# Patient Record
Sex: Male | Born: 1940 | Race: White | Hispanic: No | Marital: Married | Smoking: Former smoker
Health system: Southern US, Community
[De-identification: ages and names within clinical notes are randomized; demographics above are authoritative.]

## PROBLEM LIST (undated history)

## (undated) DIAGNOSIS — M1990 Unspecified osteoarthritis, unspecified site: Secondary | ICD-10-CM

## (undated) DIAGNOSIS — I714 Abdominal aortic aneurysm, without rupture: Secondary | ICD-10-CM

## (undated) DIAGNOSIS — E669 Obesity, unspecified: Secondary | ICD-10-CM

## (undated) DIAGNOSIS — G250 Essential tremor: Secondary | ICD-10-CM

## (undated) DIAGNOSIS — E785 Hyperlipidemia, unspecified: Secondary | ICD-10-CM

## (undated) DIAGNOSIS — R269 Unspecified abnormalities of gait and mobility: Secondary | ICD-10-CM

## (undated) DIAGNOSIS — I679 Cerebrovascular disease, unspecified: Secondary | ICD-10-CM

## (undated) DIAGNOSIS — I639 Cerebral infarction, unspecified: Secondary | ICD-10-CM

## (undated) DIAGNOSIS — I209 Angina pectoris, unspecified: Secondary | ICD-10-CM

## (undated) DIAGNOSIS — I10 Essential (primary) hypertension: Secondary | ICD-10-CM

## (undated) DIAGNOSIS — N200 Calculus of kidney: Secondary | ICD-10-CM

## (undated) HISTORY — DX: Hyperlipidemia, unspecified: E78.5

## (undated) HISTORY — DX: Unspecified osteoarthritis, unspecified site: M19.90

## (undated) HISTORY — DX: Unspecified abnormalities of gait and mobility: R26.9

## (undated) HISTORY — DX: Obesity, unspecified: E66.9

## (undated) HISTORY — DX: Angina pectoris, unspecified: I20.9

## (undated) HISTORY — DX: Essential (primary) hypertension: I10

## (undated) HISTORY — DX: Cerebrovascular disease, unspecified: I67.9

---

## 1975-09-03 DIAGNOSIS — N200 Calculus of kidney: Secondary | ICD-10-CM

## 1975-09-03 HISTORY — DX: Calculus of kidney: N20.0

## 1994-09-02 DIAGNOSIS — I714 Abdominal aortic aneurysm, without rupture, unspecified: Secondary | ICD-10-CM

## 1994-09-02 HISTORY — DX: Abdominal aortic aneurysm, without rupture: I71.4

## 1994-09-02 HISTORY — PX: ABDOMINAL AORTIC ANEURYSM REPAIR: SUR1152

## 1994-09-02 HISTORY — PX: HERNIA REPAIR: SHX51

## 1994-09-02 HISTORY — DX: Abdominal aortic aneurysm, without rupture, unspecified: I71.40

## 1994-09-02 HISTORY — PX: CHOLECYSTECTOMY: SHX55

## 2002-09-02 DIAGNOSIS — I639 Cerebral infarction, unspecified: Secondary | ICD-10-CM

## 2002-09-02 HISTORY — DX: Cerebral infarction, unspecified: I63.9

## 2003-04-25 ENCOUNTER — Ambulatory Visit (HOSPITAL_COMMUNITY): Admission: RE | Admit: 2003-04-25 | Discharge: 2003-04-25 | Payer: Self-pay | Admitting: Emergency Medicine

## 2003-04-25 ENCOUNTER — Encounter: Payer: Self-pay | Admitting: Emergency Medicine

## 2003-05-05 ENCOUNTER — Ambulatory Visit (HOSPITAL_COMMUNITY): Admission: RE | Admit: 2003-05-05 | Discharge: 2003-05-05 | Payer: Self-pay | Admitting: Neurology

## 2003-05-05 ENCOUNTER — Encounter: Payer: Self-pay | Admitting: Neurology

## 2003-05-11 ENCOUNTER — Encounter: Payer: Self-pay | Admitting: Cardiology

## 2003-05-11 ENCOUNTER — Ambulatory Visit (HOSPITAL_COMMUNITY): Admission: RE | Admit: 2003-05-11 | Discharge: 2003-05-11 | Payer: Self-pay | Admitting: Neurology

## 2003-10-25 ENCOUNTER — Ambulatory Visit (HOSPITAL_COMMUNITY): Admission: RE | Admit: 2003-10-25 | Discharge: 2003-10-25 | Payer: Self-pay | Admitting: Neurology

## 2010-03-22 ENCOUNTER — Encounter: Admission: RE | Admit: 2010-03-22 | Discharge: 2010-03-22 | Payer: Self-pay | Admitting: Family Medicine

## 2011-04-08 ENCOUNTER — Ambulatory Visit
Admission: RE | Admit: 2011-04-08 | Discharge: 2011-04-08 | Disposition: A | Discharge status: Home / Self Care | Payer: BC Managed Care – PPO | Source: Ambulatory Visit | Attending: Family Medicine | Admitting: Family Medicine

## 2011-04-08 ENCOUNTER — Other Ambulatory Visit: Payer: Self-pay | Admitting: Family Medicine

## 2011-04-08 DIAGNOSIS — R0789 Other chest pain: Secondary | ICD-10-CM

## 2011-04-23 ENCOUNTER — Ambulatory Visit: Payer: BC Managed Care – PPO | Attending: Family Medicine

## 2011-04-23 DIAGNOSIS — IMO0001 Reserved for inherently not codable concepts without codable children: Secondary | ICD-10-CM | POA: Insufficient documentation

## 2011-04-23 DIAGNOSIS — M546 Pain in thoracic spine: Secondary | ICD-10-CM | POA: Insufficient documentation

## 2011-11-07 ENCOUNTER — Ambulatory Visit: Payer: Medicare Other | Attending: Family Medicine

## 2011-11-07 DIAGNOSIS — R5381 Other malaise: Secondary | ICD-10-CM | POA: Insufficient documentation

## 2011-11-07 DIAGNOSIS — IMO0001 Reserved for inherently not codable concepts without codable children: Secondary | ICD-10-CM | POA: Insufficient documentation

## 2011-11-07 DIAGNOSIS — M25619 Stiffness of unspecified shoulder, not elsewhere classified: Secondary | ICD-10-CM | POA: Insufficient documentation

## 2011-11-07 DIAGNOSIS — M25519 Pain in unspecified shoulder: Secondary | ICD-10-CM | POA: Insufficient documentation

## 2011-11-08 ENCOUNTER — Ambulatory Visit: Payer: Medicare Other | Admitting: Physical Therapy

## 2011-11-11 ENCOUNTER — Ambulatory Visit: Payer: Medicare Other

## 2011-11-14 ENCOUNTER — Ambulatory Visit: Payer: Medicare Other

## 2011-11-18 ENCOUNTER — Ambulatory Visit: Payer: Medicare Other

## 2011-11-21 ENCOUNTER — Ambulatory Visit: Payer: Medicare Other

## 2012-08-16 DIAGNOSIS — I63239 Cerebral infarction due to unspecified occlusion or stenosis of unspecified carotid arteries: Secondary | ICD-10-CM | POA: Insufficient documentation

## 2012-08-16 DIAGNOSIS — I651 Occlusion and stenosis of basilar artery: Secondary | ICD-10-CM | POA: Insufficient documentation

## 2012-08-16 DIAGNOSIS — G250 Essential tremor: Secondary | ICD-10-CM | POA: Insufficient documentation

## 2012-10-24 ENCOUNTER — Encounter: Payer: Self-pay | Admitting: Neurology

## 2012-10-24 DIAGNOSIS — I63239 Cerebral infarction due to unspecified occlusion or stenosis of unspecified carotid arteries: Secondary | ICD-10-CM

## 2012-10-24 DIAGNOSIS — I651 Occlusion and stenosis of basilar artery: Secondary | ICD-10-CM

## 2012-10-24 DIAGNOSIS — G252 Other specified forms of tremor: Secondary | ICD-10-CM

## 2012-12-07 ENCOUNTER — Encounter: Payer: Self-pay | Admitting: Neurology

## 2012-12-07 ENCOUNTER — Ambulatory Visit (INDEPENDENT_AMBULATORY_CARE_PROVIDER_SITE_OTHER): Payer: Medicare Other | Admitting: Neurology

## 2012-12-07 VITALS — BP 125/70 | HR 40 | Ht 69.5 in | Wt 204.0 lb

## 2012-12-07 DIAGNOSIS — G250 Essential tremor: Secondary | ICD-10-CM

## 2012-12-07 DIAGNOSIS — G252 Other specified forms of tremor: Secondary | ICD-10-CM

## 2012-12-07 MED ORDER — CLONAZEPAM 0.5 MG PO TABS: 0.5000 mg | ORAL_TABLET | Freq: Two times a day (BID) | ORAL | Status: DC

## 2012-12-07 NOTE — Progress Notes (Signed)
Reason for visit: Tremor  [name] is an 72 y.o. male  History of present illness:  [name] is a 72 year old right-handed white male with a history of a benign essential tremor. The patient was given a trial on Mysoline in the past, but he could not tolerate the medication. The patient took Topamax, but this made him feel dopey and drowsy. In the past, the patient notes that alcohol will help the tremor transiently. The patient is on Toprol, and he takes propranolol if needed for the tremor. The patient has difficulty with handwriting, and he indicates that he can use a mouse and type on the computer. The patient does have some difficulty with feeding himself. The patient has had to give up fishing because of the tremor. The patient returns for an evaluation.  Past Medical History  Diagnosis Date  . Tremor   . Hypertension   . Obese   . Dyslipidemia   . Cerebrovascular disease   . Arthritis   . Stroke     Brainstem stroke  . Movement disorder     Essential tremor    Past Surgical History  Procedure Laterality Date  . Gallbladder surgery    . Abdominal aortic aneurysm repair      Family History  Problem Relation Age of Onset  . Cancer Mother   . Tremor Mother   . Lung cancer Father   . Tremor Brother     Social history:  reports that he quit smoking about 43 years ago. He does not have any smokeless tobacco history on file. He reports that  drinks alcohol. He reports that he does not use illicit drugs.  Allergies:  Allergies  Allergen Reactions  . Penicillins   . Mysoline (Primidone)     Medications:  Current Outpatient Prescriptions on File Prior to Visit  Medication Sig Dispense Refill  . atorvastatin (LIPITOR) 40 MG tablet Take 40 mg by mouth daily.      . celecoxib (CELEBREX) 200 MG capsule Take 200 mg by mouth daily.      . clopidogrel (PLAVIX) 75 MG tablet Take 75 mg by mouth daily.      . Folic Acid-Vit B6-Vit B12 (FOLBEE) 2.5-25-1 MG TABS Take 1  tablet by mouth daily.      . hydrochlorothiazide (HYDRODIURIL) 25 MG tablet Take 25 mg by mouth daily.      . hydrOXYzine (ATARAX/VISTARIL) 10 MG tablet Take 10 mg by mouth 2 (two) times daily.      Marland Kitchen losartan (COZAAR) 100 MG tablet Take 100 mg by mouth daily.      . metoprolol succinate (TOPROL-XL) 50 MG 24 hr tablet Take 50 mg by mouth daily. Take with or immediately following a meal.      . propranolol (INDERAL) 20 MG tablet Take 20 mg by mouth 3 (three) times daily.      . Skin Protectants, Misc. (EUCERIN) cream Apply topically 2 (two) times daily as needed for dry skin.      Marland Kitchen triamcinolone (NASACORT) 55 MCG/ACT nasal inhaler Place 1 spray into the nose daily. In each nostril       No current facility-administered medications on file prior to visit.    ROS:  Out of a complete 14 system review of symptoms, the patient complains only of the following symptoms, and all other reviewed systems are negative.  Easy bruising, easy bleeding Tremor  Blood pressure 125/70, pulse 40, height 5' 9.5" (1.765 m), weight 204 lb (92.534 kg).  Physical Exam  General: The patient is alert and cooperative at the time of the examination. The patient is moderately obese.  Skin: No significant peripheral edema is noted.   Neurologic Exam  Cranial nerves: Facial symmetry is present. Speech is normal, no aphasia or dysarthria is noted. Extraocular movements are full. Visual fields are full. The patient has a head and neck tremor, side to side. No vocal tremor is noted.  Motor: The patient has good strength in all 4 extremities.  Coordination: The patient has good finger-nose-finger and heel-to-shin bilaterally. A significant intention tremor is noted with finger-nose-finger bilaterally.  Gait and station: The patient has a normal gait. Tandem gait is normal. Romberg is negative. No drift is seen.  Reflexes: Deep tendon reflexes are symmetric.   Assessment/Plan:  One. Benign essential  tremor  The patient is having ongoing issues with the tremor. The patient will be given a trial on clonazepam, and if this is effective, we can adjust the dose as needed. If not, I think the patient will likely deal with the tremor without any medications whatsoever. The patient has already been unable to tolerate several medications. The patient otherwise will followup in 4-6 months.  Marlan Palau MD 12/07/2012 8:38 PM  [facility] [address] [place], [place] [address]  Phone [phone] Fax [phone]

## 2013-04-06 ENCOUNTER — Other Ambulatory Visit: Payer: Self-pay | Admitting: Family Medicine

## 2013-04-06 DIAGNOSIS — I714 Abdominal aortic aneurysm, without rupture: Secondary | ICD-10-CM

## 2013-04-12 ENCOUNTER — Ambulatory Visit
Admission: RE | Admit: 2013-04-12 | Discharge: 2013-04-12 | Disposition: A | Discharge status: Home / Self Care | Payer: Medicare Other | Source: Ambulatory Visit | Attending: Family Medicine | Admitting: Family Medicine

## 2013-04-12 DIAGNOSIS — I714 Abdominal aortic aneurysm, without rupture, unspecified: Secondary | ICD-10-CM

## 2013-04-12 MED ORDER — GADOBENATE DIMEGLUMINE 529 MG/ML IV SOLN
20.0000 mL | Freq: Once | INTRAVENOUS | Status: AC | PRN
  Administered 2013-04-12: 20 mL via INTRAVENOUS

## 2013-05-24 ENCOUNTER — Ambulatory Visit: Payer: Medicare Other | Admitting: Neurology

## 2013-06-08 ENCOUNTER — Encounter: Payer: Self-pay | Admitting: Podiatry

## 2013-06-08 ENCOUNTER — Ambulatory Visit (INDEPENDENT_AMBULATORY_CARE_PROVIDER_SITE_OTHER): Payer: Medicare Other | Admitting: Podiatry

## 2013-06-08 ENCOUNTER — Ambulatory Visit: Payer: Medicare Other | Admitting: Podiatry

## 2013-06-08 VITALS — BP 146/82 | HR 55 | Temp 97.5°F | Resp 20 | Ht 70.0 in | Wt 248.2 lb

## 2013-06-08 DIAGNOSIS — B351 Tinea unguium: Secondary | ICD-10-CM

## 2013-06-08 DIAGNOSIS — M79609 Pain in unspecified limb: Secondary | ICD-10-CM | POA: Insufficient documentation

## 2013-06-08 NOTE — Progress Notes (Signed)
Dale Lynch presents today for routine nail debridement. Complaining of painful nails 1 through 5 bilaterally.  Objective: Vital signs are stable he is alert and oriented x3. I have reviewed his past medical history medications and allergies. Pulses are palpable bilateral lower extremity. His nails are thick yellow dystrophic clinically mycotic 1 through 5.  Assessment: Pain in limb secondary to onychomycosis 1 through 5 bilateral.  Plan: Debridement of nails 1 through 5 bilateral covered service secondary to pain.

## 2013-06-14 ENCOUNTER — Encounter: Payer: Self-pay | Admitting: Interventional Cardiology

## 2013-06-14 ENCOUNTER — Ambulatory Visit (INDEPENDENT_AMBULATORY_CARE_PROVIDER_SITE_OTHER): Payer: Medicare Other | Admitting: Interventional Cardiology

## 2013-06-14 ENCOUNTER — Encounter: Payer: Self-pay | Admitting: Cardiology

## 2013-06-14 VITALS — BP 168/82 | HR 49 | Ht 70.0 in | Wt 249.0 lb

## 2013-06-14 DIAGNOSIS — R079 Chest pain, unspecified: Secondary | ICD-10-CM

## 2013-06-14 DIAGNOSIS — E669 Obesity, unspecified: Secondary | ICD-10-CM

## 2013-06-14 DIAGNOSIS — I10 Essential (primary) hypertension: Secondary | ICD-10-CM | POA: Insufficient documentation

## 2013-06-14 DIAGNOSIS — I679 Cerebrovascular disease, unspecified: Secondary | ICD-10-CM | POA: Insufficient documentation

## 2013-06-14 DIAGNOSIS — I209 Angina pectoris, unspecified: Secondary | ICD-10-CM

## 2013-06-14 DIAGNOSIS — E785 Hyperlipidemia, unspecified: Secondary | ICD-10-CM

## 2013-06-14 LAB — CBC WITH DIFFERENTIAL/PLATELET
Basophils Relative: 0.4 % (ref 0.0–3.0)
Eosinophils Relative: 2.4 % (ref 0.0–5.0)
HCT: 44.3 % (ref 39.0–52.0)
Hemoglobin: 15.2 g/dL (ref 13.0–17.0)
Lymphs Abs: 2.3 10*3/uL (ref 0.7–4.0)
MCV: 91.5 fl (ref 78.0–100.0)
Monocytes Absolute: 0.8 10*3/uL (ref 0.1–1.0)
Neutro Abs: 4.7 10*3/uL (ref 1.4–7.7)
RBC: 4.84 Mil/uL (ref 4.22–5.81)
WBC: 8 10*3/uL (ref 4.5–10.5)

## 2013-06-14 LAB — BASIC METABOLIC PANEL
CO2: 32 mEq/L (ref 19–32)
Glucose, Bld: 103 mg/dL — ABNORMAL HIGH (ref 70–99)
Potassium: 3.7 mEq/L (ref 3.5–5.1)
Sodium: 140 mEq/L (ref 135–145)

## 2013-06-14 MED ORDER — ISOSORBIDE MONONITRATE ER 30 MG PO TB24: 30.0000 mg | ORAL_TABLET | Freq: Every day | ORAL | Status: DC

## 2013-06-14 MED ORDER — NITROGLYCERIN 0.4 MG SL SUBL: 0.4000 mg | SUBLINGUAL_TABLET | SUBLINGUAL | Status: DC | PRN

## 2013-06-14 NOTE — Progress Notes (Signed)
Patient ID: Dale Lynch, male   DOB: [birth year], 72 y.o.   MRN: [id number]     Patient ID: Dale Lynch MRN: [id number] DOB/AGE: 02/15/1941 72 y.o.  Admit date: (Not on file) Referring Physician Dr. Docia Chuck   Reason for Consultation: chest discomfort  HPI: 72 y/o who has had a h/o vascular disease, including a AA repair, and a vertebral artery occlusion causing a stroke.  He has had a normal stress test in 1999.  He has had some atypical chest pain on both sides of his chest pain.  He described it as a rib pain.  Over the past few weeks, e has had a tightness with walking up hills and walking on flat ground that resolves with rest.  He can go an 1/8 mile before stopping.  He feels the sensation resolves within a minute of rest.  No associated N/V/syncope/ dizziness/palpitations.  Never used NTG. No orthopnea.   The patient has been on metoprolol as well as amlodipine long-term. His symptoms of angina occurred while on both of these medications. His metoprolol was recently stopped due to bradycardia. He has not noticed any change in his symptoms since stopping the metoprolol. He is planning a trip for work to [place] on Friday. He would like to have his evaluation prior to this time.    Past Medical History  Diagnosis Date  . Tremor   . Hypertension   . Obese   . Dyslipidemia   . Cerebrovascular disease   . Arthritis   . Stroke     Brainstem stroke  . Movement disorder     Essential tremor    Family History  Problem Relation Age of Onset  . Cancer Mother   . Tremor Mother   . Lung cancer Father   . Tremor Brother     History   Social History  . Marital Status: Married    Spouse Name: N/A    Number of Children: N/A  . Years of Education: N/A   Occupational History  .      Dog handler & Mediator   Social History Main Topics  . Smoking status: Former Smoker    Quit date: 01/31/1969  . Smokeless tobacco: Not on file  . Alcohol Use: Yes     Comment: drinks alchol on  occasion  . Drug Use: No  . Sexual Activity: Not on file   Other Topics Concern  . Not on file   Social History Narrative  . No narrative on file    Past Surgical History  Procedure Laterality Date  . Gallbladder surgery    . Abdominal aortic aneurysm repair        (Not in a hospital admission)  Review of systems complete and found to be negative unless listed above .  No nausea, vomiting.  No fever chills, No focal weakness,  No palpitations.  Physical Exam:   Weight: 249 lb (112.946 kg)  160/82 Physical exam:  [place]/AT EOMI No JVD, No carotid bruit RRR S1S2  No wheezing Soft. NT, nondistended No edema. 2+ right radial pulse No focal motor or sensory deficits Normal affect  Labs:   Lab Results  Component Value Date   WBC 8.0 06/14/2013     Recent Labs Lab 06/14/13 1508  NA 140  K 3.7  CL 100  CO2 32  BUN 11  CREATININE 0.9  CALCIUM 9.4  GLUCOSE 103*   No results found for this basename: CKTOTAL,  CKMB,  CKMBINDEX,  TROPONINI  No results found for this basename: CHOL   No results found for this basename: HDL   No results found for this basename: LDLCALC   No results found for this basename: TRIG   No results found for this basename: CHOLHDL   No results found for this basename: LDLDIRECT      Radiology: none EKG: Normal sinus rhythm, nonspecific ST-T wave changes in the anterior, lateral and inferior leads  ASSESSMENT AND PLAN: Recent onset angina  1. Angina: We discussed stress test versus catheterization to evaluate his symptoms. Given his long history of vascular disease which includes an abdominal aortic aneurysm repair as well as occluded vertebral artery causing stroke, we decided on cardiac catheterization. His symptoms are typical and related to minimal exertion. They're relieved with rest. We'll also start Imdur 30 mg daily. Prescription for sublingual nitroglycerin when necessary was also given the patient. If we performed a stress  test that was negative and he continued to have these typical symptoms, I think we would end up proceeding with catheterization.  Risks and benefits cardiac catheter explained to the patient and his line of proceed.  2. peripheral vascular disease: As noted above with AAA and CVA from vertebral artery occlusion.  Hypertension: Blood pressure elevated today. We'll have to have it checked at home. Once his angina has improved, will encourage him to exercise at least 5 days a week 30 minutes a day. He is to avoid salt as well.  Obesity: He would benefit from weight loss.  Hyperlipidemia: Continue Lipitor. Last LDL was 98. Signed:   Fredric Mare, MD, [facility] 06/14/2013, 5:36 PM

## 2013-06-14 NOTE — Patient Instructions (Signed)
Your physician has requested that you have a cardiac catheterization. Cardiac catheterization is used to diagnose and/or treat various heart conditions. Doctors may recommend this procedure for a number of different reasons. The most common reason is to evaluate chest pain. Chest pain can be a symptom of coronary artery disease (CAD), and cardiac catheterization can show whether plaque is narrowing or blocking your heart's arteries. This procedure is also used to evaluate the valves, as well as measure the blood flow and oxygen levels in different parts of your heart. For further information please visit [email or web address] Please follow instruction sheet, as given.  Your physician has recommended you make the following change in your medication:   1. Start Imdur 30mg  1 tab by mouth daily.  2. Use Nitro as needed for Chest Pain.  You will go to the lab today for Bmet, CBC with Diff and PT/INR for pre-cath lab work.

## 2013-06-15 ENCOUNTER — Encounter (HOSPITAL_COMMUNITY): Payer: Self-pay | Admitting: Respiratory Therapy

## 2013-06-16 ENCOUNTER — Ambulatory Visit (HOSPITAL_COMMUNITY): Payer: Medicare Other

## 2013-06-16 ENCOUNTER — Inpatient Hospital Stay (HOSPITAL_COMMUNITY)
Admission: RE | Admit: 2013-06-16 | Discharge: 2013-06-26 | DRG: 234 | Disposition: A | Discharge status: Home / Self Care | Payer: Medicare Other | Source: Ambulatory Visit | Attending: Surgery | Admitting: Surgery

## 2013-06-16 ENCOUNTER — Encounter (HOSPITAL_COMMUNITY)
Admission: RE | Disposition: A | Discharge status: Home / Self Care | Payer: Self-pay | Source: Ambulatory Visit | Attending: Interventional Cardiology

## 2013-06-16 ENCOUNTER — Other Ambulatory Visit: Payer: Self-pay | Admitting: *Deleted

## 2013-06-16 ENCOUNTER — Encounter (HOSPITAL_COMMUNITY): Payer: Self-pay | Admitting: General Practice

## 2013-06-16 DIAGNOSIS — I200 Unstable angina: Secondary | ICD-10-CM | POA: Diagnosis present

## 2013-06-16 DIAGNOSIS — Z01810 Encounter for preprocedural cardiovascular examination: Secondary | ICD-10-CM

## 2013-06-16 DIAGNOSIS — I2510 Atherosclerotic heart disease of native coronary artery without angina pectoris: Secondary | ICD-10-CM

## 2013-06-16 DIAGNOSIS — I209 Angina pectoris, unspecified: Secondary | ICD-10-CM

## 2013-06-16 DIAGNOSIS — E785 Hyperlipidemia, unspecified: Secondary | ICD-10-CM | POA: Diagnosis present

## 2013-06-16 DIAGNOSIS — J9819 Other pulmonary collapse: Secondary | ICD-10-CM | POA: Diagnosis not present

## 2013-06-16 DIAGNOSIS — E8779 Other fluid overload: Secondary | ICD-10-CM | POA: Diagnosis not present

## 2013-06-16 DIAGNOSIS — Z951 Presence of aortocoronary bypass graft: Secondary | ICD-10-CM

## 2013-06-16 DIAGNOSIS — Z7902 Long term (current) use of antithrombotics/antiplatelets: Secondary | ICD-10-CM

## 2013-06-16 DIAGNOSIS — Z8673 Personal history of transient ischemic attack (TIA), and cerebral infarction without residual deficits: Secondary | ICD-10-CM

## 2013-06-16 DIAGNOSIS — D696 Thrombocytopenia, unspecified: Secondary | ICD-10-CM | POA: Diagnosis not present

## 2013-06-16 DIAGNOSIS — G250 Essential tremor: Secondary | ICD-10-CM | POA: Diagnosis present

## 2013-06-16 DIAGNOSIS — Z87891 Personal history of nicotine dependence: Secondary | ICD-10-CM

## 2013-06-16 DIAGNOSIS — Z79899 Other long term (current) drug therapy: Secondary | ICD-10-CM

## 2013-06-16 DIAGNOSIS — Z6835 Body mass index (BMI) 35.0-35.9, adult: Secondary | ICD-10-CM

## 2013-06-16 DIAGNOSIS — D62 Acute posthemorrhagic anemia: Secondary | ICD-10-CM | POA: Diagnosis not present

## 2013-06-16 DIAGNOSIS — I739 Peripheral vascular disease, unspecified: Secondary | ICD-10-CM | POA: Diagnosis present

## 2013-06-16 DIAGNOSIS — I10 Essential (primary) hypertension: Secondary | ICD-10-CM

## 2013-06-16 DIAGNOSIS — I498 Other specified cardiac arrhythmias: Secondary | ICD-10-CM | POA: Diagnosis not present

## 2013-06-16 DIAGNOSIS — K5900 Constipation, unspecified: Secondary | ICD-10-CM | POA: Diagnosis not present

## 2013-06-16 DIAGNOSIS — I6509 Occlusion and stenosis of unspecified vertebral artery: Secondary | ICD-10-CM | POA: Diagnosis present

## 2013-06-16 HISTORY — DX: Cerebral infarction, unspecified: I63.9

## 2013-06-16 HISTORY — DX: Calculus of kidney: N20.0

## 2013-06-16 HISTORY — PX: CORONARY ANGIOPLASTY WITH STENT PLACEMENT: SHX49

## 2013-06-16 HISTORY — DX: Abdominal aortic aneurysm, without rupture: I71.4

## 2013-06-16 HISTORY — PX: LEFT HEART CATHETERIZATION WITH CORONARY ANGIOGRAM: SHX5451

## 2013-06-16 HISTORY — DX: Essential tremor: G25.0

## 2013-06-16 SURGERY — LEFT HEART CATHETERIZATION WITH CORONARY ANGIOGRAM
Anesthesia: LOCAL

## 2013-06-16 MED ORDER — ISOSORBIDE MONONITRATE ER 30 MG PO TB24
30.0000 mg | ORAL_TABLET | Freq: Every day | ORAL | Status: DC
  Administered 2013-06-17 – 2013-06-20 (×4): 30 mg via ORAL
  Filled 2013-06-16 (×5): qty 1

## 2013-06-16 MED ORDER — SODIUM CHLORIDE 0.9 % IJ SOLN: 3.0000 mL | INTRAMUSCULAR | Status: DC | PRN

## 2013-06-16 MED ORDER — ACETAMINOPHEN 325 MG PO TABS: 650.0000 mg | ORAL_TABLET | ORAL | Status: DC | PRN

## 2013-06-16 MED ORDER — ASPIRIN 81 MG PO CHEW
81.0000 mg | CHEWABLE_TABLET | ORAL | Status: AC
  Administered 2013-06-16: 81 mg via ORAL

## 2013-06-16 MED ORDER — HYDROXYZINE HCL 10 MG PO TABS
10.0000 mg | ORAL_TABLET | Freq: Two times a day (BID) | ORAL | Status: DC
  Administered 2013-06-16 – 2013-06-19 (×6): 10 mg via ORAL
  Filled 2013-06-16 (×7): qty 1

## 2013-06-16 MED ORDER — FENTANYL CITRATE 0.05 MG/ML IJ SOLN
INTRAMUSCULAR | Status: AC
  Filled 2013-06-16: qty 2

## 2013-06-16 MED ORDER — SODIUM CHLORIDE 0.9 % IV SOLN: 1.0000 mL/kg/h | INTRAVENOUS | Status: AC

## 2013-06-16 MED ORDER — MIDAZOLAM HCL 2 MG/2ML IJ SOLN
INTRAMUSCULAR | Status: AC
  Filled 2013-06-16: qty 2

## 2013-06-16 MED ORDER — NITROGLYCERIN 0.2 MG/ML ON CALL CATH LAB
INTRAVENOUS | Status: AC
  Filled 2013-06-16: qty 1

## 2013-06-16 MED ORDER — HYDROCERIN EX CREA
TOPICAL_CREAM | Freq: Two times a day (BID) | CUTANEOUS | Status: DC | PRN
  Filled 2013-06-16: qty 113

## 2013-06-16 MED ORDER — PROPRANOLOL HCL 20 MG PO TABS
20.0000 mg | ORAL_TABLET | Freq: Two times a day (BID) | ORAL | Status: DC
  Administered 2013-06-16 – 2013-06-19 (×6): 20 mg via ORAL
  Filled 2013-06-16 (×7): qty 1

## 2013-06-16 MED ORDER — ALBUTEROL SULFATE (5 MG/ML) 0.5% IN NEBU
2.5000 mg | INHALATION_SOLUTION | Freq: Once | RESPIRATORY_TRACT | Status: AC
  Administered 2013-06-16: 15:00:00 2.5 mg via RESPIRATORY_TRACT

## 2013-06-16 MED ORDER — ASPIRIN 81 MG PO CHEW
CHEWABLE_TABLET | ORAL | Status: AC
  Filled 2013-06-16: qty 1

## 2013-06-16 MED ORDER — NITROGLYCERIN 0.4 MG SL SUBL: 0.4000 mg | SUBLINGUAL_TABLET | SUBLINGUAL | Status: DC | PRN

## 2013-06-16 MED ORDER — LOSARTAN POTASSIUM 50 MG PO TABS
100.0000 mg | ORAL_TABLET | Freq: Every day | ORAL | Status: DC
  Administered 2013-06-17 – 2013-06-20 (×4): 100 mg via ORAL
  Filled 2013-06-16 (×5): qty 2

## 2013-06-16 MED ORDER — LIDOCAINE HCL (PF) 1 % IJ SOLN
INTRAMUSCULAR | Status: AC
  Filled 2013-06-16: qty 30

## 2013-06-16 MED ORDER — ASPIRIN 81 MG PO CHEW
81.0000 mg | CHEWABLE_TABLET | Freq: Every day | ORAL | Status: DC
  Administered 2013-06-17 – 2013-06-20 (×4): 81 mg via ORAL
  Filled 2013-06-16 (×4): qty 1

## 2013-06-16 MED ORDER — ONDANSETRON HCL 4 MG/2ML IJ SOLN: 4.0000 mg | Freq: Four times a day (QID) | INTRAMUSCULAR | Status: DC | PRN

## 2013-06-16 MED ORDER — SODIUM CHLORIDE 0.9 % IJ SOLN: 3.0000 mL | Freq: Two times a day (BID) | INTRAMUSCULAR | Status: DC

## 2013-06-16 MED ORDER — SIMVASTATIN 10 MG PO TABS
10.0000 mg | ORAL_TABLET | Freq: Every day | ORAL | Status: DC
  Administered 2013-06-16 – 2013-06-18 (×3): 10 mg via ORAL
  Filled 2013-06-16 (×4): qty 1

## 2013-06-16 MED ORDER — FLUTICASONE PROPIONATE 50 MCG/ACT NA SUSP
1.0000 | Freq: Every day | NASAL | Status: DC
  Administered 2013-06-16 – 2013-06-18 (×2): 1 via NASAL
  Filled 2013-06-16: qty 16

## 2013-06-16 MED ORDER — SODIUM CHLORIDE 0.9 % IV SOLN: 250.0000 mL | INTRAVENOUS | Status: DC | PRN

## 2013-06-16 MED ORDER — FA-PYRIDOXINE-CYANOCOBALAMIN 2.5-25-2 MG PO TABS
1.0000 | ORAL_TABLET | Freq: Every day | ORAL | Status: DC
  Administered 2013-06-16 – 2013-06-18 (×3): 1 via ORAL
  Filled 2013-06-16 (×4): qty 1

## 2013-06-16 MED ORDER — FOLIC ACID-VIT B6-VIT B12 2.5-25-1 MG PO TABS
1.0000 | ORAL_TABLET | Freq: Every day | ORAL | Status: DC
  Filled 2013-06-16: qty 1

## 2013-06-16 MED ORDER — HEPARIN (PORCINE) IN NACL 2-0.9 UNIT/ML-% IJ SOLN
INTRAMUSCULAR | Status: AC
  Filled 2013-06-16: qty 1000

## 2013-06-16 MED ORDER — SODIUM CHLORIDE 0.9 % IV SOLN
INTRAVENOUS | Status: DC
  Administered 2013-06-16: 08:00:00 via INTRAVENOUS

## 2013-06-16 MED ORDER — HYDROCHLOROTHIAZIDE 25 MG PO TABS
25.0000 mg | ORAL_TABLET | Freq: Every day | ORAL | Status: DC
  Administered 2013-06-17 – 2013-06-20 (×4): 25 mg via ORAL
  Filled 2013-06-16 (×5): qty 1

## 2013-06-16 NOTE — Progress Notes (Signed)
Pre-op Cardiac Surgery  Carotid Findings:  Bilateral:  1-39% ICA stenosis.  Vertebral artery flow is antegrade.      Upper Extremity Right Left  Brachial Pressures 127 132  Radial Waveforms Tri Bi  Ulnar Waveforms Tri Tri  Palmar Arch (Allen's Test) Normal  Normal     Palpable pedal pulses.   Farrel Demark, RDMS, RVT 06/16/2013

## 2013-06-16 NOTE — H&P (View-Only) (Signed)
Patient ID: Dale Lynch, male   DOB: 03/31/1941, 72 y.o.   MRN: [id number]     Patient ID: Dale Lynch MRN: [id number] DOB/AGE: 02/25/1941 72 y.o.  Admit date: (Not on file) Referring Physician Dr. Koirala   Reason for Consultation: chest discomfort  HPI: 72 y/o who has had a h/o vascular disease, including a AA repair, and a vertebral artery occlusion causing a stroke.  He has had a normal stress test in 1999.  He has had some atypical chest pain on both sides of his chest pain.  He described it as a rib pain.  Over the past few weeks, e has had a tightness with walking up hills and walking on flat ground that resolves with rest.  He can go an 1/8 mile before stopping.  He feels the sensation resolves within a minute of rest.  No associated N/V/syncope/ dizziness/palpitations.  Never used NTG. No orthopnea.   The patient has been on metoprolol as well as amlodipine long-term. His symptoms of angina occurred while on both of these medications. His metoprolol was recently stopped due to bradycardia. He has not noticed any change in his symptoms since stopping the metoprolol. He is planning a trip for work to [place] on Friday. He would like to have his evaluation prior to this time.    Past Medical History  Diagnosis Date  . Tremor   . Hypertension   . Obese   . Dyslipidemia   . Cerebrovascular disease   . Arthritis   . Stroke     Brainstem stroke  . Movement disorder     Essential tremor    Family History  Problem Relation Age of Onset  . Cancer Mother   . Tremor Mother   . Lung cancer Father   . Tremor Brother     History   Social History  . Marital Status: Married    Spouse Name: N/A    Number of Children: N/A  . Years of Education: N/A   Occupational History  .      Dog handler & Mediator   Social History Main Topics  . Smoking status: Former Smoker    Quit date: 01/31/1969  . Smokeless tobacco: Not on file  . Alcohol Use: Yes     Comment: drinks alchol on  occasion  . Drug Use: No  . Sexual Activity: Not on file   Other Topics Concern  . Not on file   Social History Narrative  . No narrative on file    Past Surgical History  Procedure Laterality Date  . Gallbladder surgery    . Abdominal aortic aneurysm repair        (Not in a hospital admission)  Review of systems complete and found to be negative unless listed above .  No nausea, vomiting.  No fever chills, No focal weakness,  No palpitations.  Physical Exam:   Weight: 249 lb (112.946 kg)  160/82 Physical exam:  [place]/AT EOMI No JVD, No carotid bruit RRR S1S2  No wheezing Soft. NT, nondistended No edema. 2+ right radial pulse No focal motor or sensory deficits Normal affect  Labs:   Lab Results  Component Value Date   WBC 8.0 06/14/2013     Recent Labs Lab 06/14/13 1508  NA 140  K 3.7  CL 100  CO2 32  BUN 11  CREATININE 0.9  CALCIUM 9.4  GLUCOSE 103*   No results found for this basename: CKTOTAL,  CKMB,  CKMBINDEX,  TROPONINI      No results found for this basename: CHOL   No results found for this basename: HDL   No results found for this basename: LDLCALC   No results found for this basename: TRIG   No results found for this basename: CHOLHDL   No results found for this basename: LDLDIRECT      Radiology: none EKG: Normal sinus rhythm, nonspecific ST-T wave changes in the anterior, lateral and inferior leads  ASSESSMENT AND PLAN: Recent onset angina  1. Angina: We discussed stress test versus catheterization to evaluate his symptoms. Given his long history of vascular disease which includes an abdominal aortic aneurysm repair as well as occluded vertebral artery causing stroke, we decided on cardiac catheterization. His symptoms are typical and related to minimal exertion. They're relieved with rest. We'll also start Imdur 30 mg daily. Prescription for sublingual nitroglycerin when necessary was also given the patient. If we performed a stress  test that was negative and he continued to have these typical symptoms, I think we would end up proceeding with catheterization.  Risks and benefits cardiac catheter explained to the patient and his line of proceed.  2. peripheral vascular disease: As noted above with AAA and CVA from vertebral artery occlusion.  Hypertension: Blood pressure elevated today. We'll have to have it checked at home. Once his angina has improved, will encourage him to exercise at least 5 days a week 30 minutes a day. He is to avoid salt as well.  Obesity: He would benefit from weight loss.  Hyperlipidemia: Continue Lipitor. Last LDL was 98. Signed:   Jay S. Dilia Alemany, MD, FACC 06/14/2013, 5:36 PM  

## 2013-06-16 NOTE — Progress Notes (Signed)
TR BAND REMOVAL  LOCATION:  right radial  DEFLATED PER PROTOCOL:  yes  TIME BAND OFF / DRESSING APPLIED:  1145   SITE UPON ARRIVAL:   Level 0  SITE AFTER BAND REMOVAL:  Level 0  REVERSE ALLEN'S TEST:    positive  CIRCULATION SENSATION AND MOVEMENT:  Within Normal Limits  yes  COMMENTS:

## 2013-06-16 NOTE — Interval H&P Note (Signed)
Cath Lab Visit (complete for each Cath Lab visit)  Clinical Evaluation Leading to the Procedure:   ACS: no  Non-ACS:    Anginal Classification: CCS III  Anti-ischemic medical therapy: Maximal Therapy (2 or more classes of medications)  Non-Invasive Test Results: No non-invasive testing performed  Prior CABG: No previous CABG      History and Physical Interval Note:  06/16/2013 8:50 AM  Dale Lynch  has presented today for surgery, with the diagnosis of cad  The various methods of treatment have been discussed with the patient and family. After consideration of risks, benefits and other options for treatment, the patient has consented to  Procedure(s): LEFT HEART CATHETERIZATION WITH CORONARY ANGIOGRAM (N/A) as a surgical intervention .  The patient's history has been reviewed, patient examined, no change in status, stable for surgery.  I have reviewed the patient's chart and labs.  Questions were answered to the patient's satisfaction.     Dale Lynch S.

## 2013-06-16 NOTE — CV Procedure (Signed)
       PROCEDURE:  Left heart catheterization with selective coronary angiography, left ventriculogram.  INDICATIONS:  Class III angina  The risks, benefits, and details of the procedure were explained to the patient.  The patient verbalized understanding and wanted to proceed.  Informed written consent was obtained.  PROCEDURE TECHNIQUE:  After Xylocaine anesthesia a 88F slender sheath was placed in the right radial artery with a single anterior needle wall stick.   Right coronary angiography was done using a Judkins R4 guide catheter.  Left coronary angiography was done using a Judkins L3.5 guide catheter.  Left ventriculography was done using a pigtail catheter.  A TR band was used for hemostasis.   CONTRAST:  Total of 90 cc.  COMPLICATIONS:  None.    HEMODYNAMICS:  Aortic pressure was 109/57; LV pressure was 109/4; LVEDP 11.  There was no gradient between the left ventricle and aorta.    ANGIOGRAPHIC DATA:   The left main coronary artery is widely patent.  The left anterior descending artery is a large vessel. The midportion has moderate diffuse disease followed by a focal 90% lesion. The mid to distal LAD is medium size and caliber but appears widely patent. It is fairly tortuous. There is a large diagonal branch which appears widely patent. There is a large septal arcade which is patent as well.  The LAD appears to give collaterals to the PDA.  The left circumflex artery is a large vessel. There is a large first obtuse marginal with an ostial 70% stenosis. Just after the marginal branch, there is a 99% stenosis in the midcircumflex. The OM 2 is medium size and patent..  The right coronary artery is a large dominant vessel.  There is a 50% ostial stenosis. There are sequential 90% stenoses in the proximal to mid vessel. In the distal vessel, there is a subtotal occlusion. The distal RCA fills antegrade as well as from left to right collaterals. Competitive flow is evident. The PDA appears  to be a reasonably sized vessel.  LEFT VENTRICULOGRAM:  Left ventricular angiogram was done in the 30 RAO projection and revealed normal left ventricular wall motion and systolic function with an estimated ejection fraction of 60%.  LVEDP was 11 mmHg.  IMPRESSIONS:  1. Normal left main coronary artery. 2. 80-90% mid left anterior descending artery lesion. 3. 99% lesion in the mid left circumflex artery. 70% ostial OM1 stenosis. 4. Ostial 50% stenosis right coronary artery.  Sequential 99% stenosis in the mid RCA.  Subtotal occluion of the distal RCA with left to right collaterals. 5. Normal left ventricular systolic function.  LVEDP 11 mmHg.  Ejection fraction 55%.  RECOMMENDATION:  Severe three-vessel coronary artery disease. Will plan for cardiothoracic surgery consultation.  The findings were explained to the patient and his wife.

## 2013-06-17 DIAGNOSIS — I209 Angina pectoris, unspecified: Secondary | ICD-10-CM

## 2013-06-17 DIAGNOSIS — I2510 Atherosclerotic heart disease of native coronary artery without angina pectoris: Secondary | ICD-10-CM

## 2013-06-17 DIAGNOSIS — I10 Essential (primary) hypertension: Secondary | ICD-10-CM

## 2013-06-17 LAB — HEPARIN LEVEL (UNFRACTIONATED): Heparin Unfractionated: 0.11 IU/mL — ABNORMAL LOW (ref 0.30–0.70)

## 2013-06-17 MED ORDER — HEPARIN (PORCINE) IN NACL 100-0.45 UNIT/ML-% IJ SOLN
1800.0000 [IU]/h | INTRAMUSCULAR | Status: DC
  Administered 2013-06-17: 18:00:00 1600 [IU]/h via INTRAVENOUS
  Administered 2013-06-17: 1350 [IU]/h via INTRAVENOUS
  Administered 2013-06-19: 1800 [IU]/h via INTRAVENOUS
  Administered 2013-06-19: 09:00:00 1750 [IU]/h via INTRAVENOUS
  Administered 2013-06-20: 1800 [IU]/h via INTRAVENOUS
  Filled 2013-06-17 (×9): qty 250

## 2013-06-17 MED ORDER — HEPARIN BOLUS VIA INFUSION
2000.0000 [IU] | Freq: Once | INTRAVENOUS | Status: AC
  Administered 2013-06-17: 2000 [IU] via INTRAVENOUS
  Filled 2013-06-17: qty 2000

## 2013-06-17 NOTE — Progress Notes (Signed)
ANTICOAGULATION CONSULT NOTE - Initial Consult  Pharmacy Consult for Heparin Indication: Severe multi-vessel coronary disease; awaiting CABG on Monday, 06/21/13   Allergies  Allergen Reactions  . Penicillins   . Mysoline [Primidone]   . Codeine Other (See Comments)    hallucinate    Patient Measurements: Height: 5\' 10"  (177.8 cm) Weight: 249 lb 9 oz (113.2 kg) IBW/kg (Calculated) : 73  kg Heparin Dosing Weight: 97.8 kg  Vital Signs: Temp: 98.2 F (36.8 C) (10/16 0807) Temp src: Oral (10/16 0807) BP: 165/91 mmHg (10/16 0807) Pulse Rate: 58 (10/16 0807)  Labs:  Recent Labs  06/14/13 1508  HGB 15.2  HCT 44.3  PLT 223.0  LABPROT 11.8  INR 1.1*  CREATININE 0.9    Estimated Creatinine Clearance: 96.3 ml/min (by C-G formula based on Cr of 0.9).   Medical History: Past Medical History  Diagnosis Date  . Tremor   . Hypertension   . Obese   . Dyslipidemia   . Cerebrovascular disease   . Other and unspecified angina pectoris   . AAA (abdominal aortic aneurysm) 1996  . Brainstem stroke 2004    "one of my vertebral arteries occluded" ,denies residual on 06/17/2013  . Arthritis     "joints" (Jun 17, 2013)  . Nephrolithiasis 1977    "passed on their own" (17-Jun-2013)  . Essential tremor     Medications:  Prescriptions prior to admission  Medication Sig Dispense Refill  . celecoxib (CELEBREX) 200 MG capsule Take 200 mg by mouth daily.      . clopidogrel (PLAVIX) 75 MG tablet Take 75 mg by mouth daily.      . fluticasone (FLONASE) 50 MCG/ACT nasal spray Place 1 spray into the nose daily.       . Folic Acid-Vit B6-Vit B12 (FOLBEE) 2.5-25-1 MG TABS Take 1 tablet by mouth daily.      . hydrochlorothiazide (HYDRODIURIL) 25 MG tablet Take 25 mg by mouth daily.      . hydrOXYzine (ATARAX/VISTARIL) 10 MG tablet Take 10 mg by mouth 2 (two) times daily.      . isosorbide mononitrate (IMDUR) 30 MG 24 hr tablet Take 1 tablet (30 mg total) by mouth daily.  90 tablet  3  .  losartan (COZAAR) 100 MG tablet Take 100 mg by mouth daily.      . nitroGLYCERIN (NITROSTAT) 0.4 MG SL tablet Place 1 tablet (0.4 mg total) under the tongue every 5 (five) minutes as needed for chest pain.  90 tablet  0  . propranolol (INDERAL) 20 MG tablet Take 20 mg by mouth 2 (two) times daily.       . simvastatin (ZOCOR) 10 MG tablet Take 10 mg by mouth at bedtime.       . Skin Protectants, Misc. (EUCERIN) cream Apply topically 2 (two) times daily as needed for dry skin.       Scheduled:  . aspirin  81 mg Oral Daily  . fluticasone  1 spray Each Nare Daily  . folic acid-pyridoxine-cyancobalamin  1 tablet Oral Daily  . hydrochlorothiazide  25 mg Oral Daily  . hydrOXYzine  10 mg Oral BID  . isosorbide mononitrate  30 mg Oral Daily  . losartan  100 mg Oral Daily  . propranolol  20 mg Oral BID  . simvastatin  10 mg Oral QHS    Assessment: 72 yo male s/p cardiac cath on 06/17/2013 revealing severe multi-vessel disease. Plan for CABG on Monday 06/21/13.  Starting IV heparin infusion today while awaits CABG.  No bleeding noted. INR 1.1, PLTC 223, H/H 15.2/44.3 on 06/16/13.  On plavix PTA, last dose taken 06/15/13. Not on anticoagulation PTA.   Goal of Therapy:  Heparin level 0.3-0.7 units/ml Monitor platelets by anticoagulation protocol: Yes   Plan:  Start IV Heparin drip 1350 units/hr (no bolus due to s/p cath 06/16/13) Draw heparin level 6 hours after heparin started.  Daily heparin level and CBC   Noah Delaine, RPh Clinical Pharmacist Pager: [phone] 06/17/2013,9:48 AM

## 2013-06-17 NOTE — Progress Notes (Signed)
CARDIAC REHAB PHASE I   PRE:  Rate/Rhythm: 55SB  BP:  Supine:   Sitting: 165/91  Standing:    SaO2:   MODE:  Ambulation: 700 ft   POST:  Rate/Rhythm: 62SR  BP:  Supine:   Sitting: 190/85 dinamapp,  150/90 manual  Standing:    SaO2:  4782-9562 Pre op ed completed with pt and wife. Discussed importance of IS and mobility after surgery. Discussed sternal precautions and how to get up and down without use of arms. Has seen pre op video and has [facility] booklet. Pt needs IS prior to going home if discharged. Gave CRP 2 brochure as pt had questions re program and insurance coverage. Walked without CP. Tolerated well except elevated BP. Took manually and BP lower.   Luetta Nutting, RN BSN  06/17/2013 8:19 AM

## 2013-06-17 NOTE — Progress Notes (Addendum)
06/17/13  Pharmacy- Heparin 1750  Heparin level 0.11  A/P:  72yo male with severe multivessel CAD, for CABG 06/21/13.  Heparin level subtherapeutic on 1350 units/hr.  No bleeding problems noted.  Will bolus as now 24hr out from cath.  1.  Heparin 2000 units IV, increase to 1600 units/hr 2.  Check heparin level 6hr  Marisue Humble, PharmD Clinical Pharmacist [facility]

## 2013-06-17 NOTE — Consult Note (Signed)
Cardiothoracic Surgery Consultation  Reason for Consult: Severe multi-vessel coronary disease Referring Physician: Dr. Everette Rank  Dale Lynch is an 72 y.o. male.  HPI:   The patient is a 72 year old gentleman who reports a several month history of atypical muscular type pain across his anterior chest that he thought may be due to statin. He stopped this and that pain resolved but then he noticed a different substernal chest tightness with exertion, resolving with rest. 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 Cath was done and shows severe multi-vessel disease.  Past Medical History  Diagnosis Date  . Tremor   . Hypertension   . Obese   . Dyslipidemia   . Cerebrovascular disease   . Other and unspecified angina pectoris   . AAA (abdominal aortic aneurysm) 1996  . Brainstem stroke 2004    "one of my vertebral arteries occluded" ,denies residual on 07/16/13  . Arthritis     "joints" (2013/07/16)  . Nephrolithiasis 1977    "passed on their own" (07-16-13)  . Essential tremor     Past Surgical History  Procedure Laterality Date  . Abdominal aortic aneurysm repair  1996  . Coronary angioplasty with stent placement  Jul 16, 2013  . Cholecystectomy  1996  . Hernia repair  1996    "belly button" (2013-07-16)    Family History  Problem Relation Age of Onset  . Cancer Mother   . Tremor Mother   . Lung cancer Father   . Tremor Brother     Social History:  reports that he has quit smoking. His smoking use included Cigarettes. He has a 15 pack-year smoking history. He has never used smokeless tobacco. He reports that he drinks about 3.6 ounces of alcohol per week. He reports that he does not use illicit drugs.  Allergies:  Allergies  Allergen Reactions  . Penicillins   . Mysoline [Primidone]   . Codeine Other (See Comments)    hallucinate    Medications:  I have reviewed the  patient's current medications. Prior to Admission:  Prescriptions prior to admission  Medication Sig Dispense Refill  . celecoxib (CELEBREX) 200 MG capsule Take 200 mg by mouth daily.      . clopidogrel (PLAVIX) 75 MG tablet Take 75 mg by mouth daily.      . fluticasone (FLONASE) 50 MCG/ACT nasal spray Place 1 spray into the nose daily.       . Folic Acid-Vit B6-Vit B12 (FOLBEE) 2.5-25-1 MG TABS Take 1 tablet by mouth daily.      . hydrochlorothiazide (HYDRODIURIL) 25 MG tablet Take 25 mg by mouth daily.      . hydrOXYzine (ATARAX/VISTARIL) 10 MG tablet Take 10 mg by mouth 2 (two) times daily.      . isosorbide mononitrate (IMDUR) 30 MG 24 hr tablet Take 1 tablet (30 mg total) by mouth daily.  90 tablet  3  . losartan (COZAAR) 100 MG tablet Take 100 mg by mouth daily.      . nitroGLYCERIN (NITROSTAT) 0.4 MG SL tablet Place 1 tablet (0.4 mg total) under the tongue every 5 (five) minutes as needed for chest pain.  90 tablet  0  . propranolol (INDERAL) 20 MG tablet Take 20 mg by mouth 2 (two) times daily.       . simvastatin (ZOCOR) 10 MG tablet Take 10 mg by mouth at bedtime.       Marland Kitchen  Skin Protectants, Misc. (EUCERIN) cream Apply topically 2 (two) times daily as needed for dry skin.       Scheduled: . aspirin  81 mg Oral Daily  . fluticasone  1 spray Each Nare Daily  . folic acid-pyridoxine-cyancobalamin  1 tablet Oral Daily  . hydrochlorothiazide  25 mg Oral Daily  . hydrOXYzine  10 mg Oral BID  . isosorbide mononitrate  30 mg Oral Daily  . losartan  100 mg Oral Daily  . propranolol  20 mg Oral BID  . simvastatin  10 mg Oral QHS   Continuous:  RUE:[id number], hydrocerin, nitroGLYCERIN, ondansetron (ZOFRAN) IV  No results found for this or any previous visit (from the past 48 hour(s)).  No results found.  Review of Systems  Constitutional: Negative for fever, chills, weight loss and malaise/fatigue.  HENT: Negative.   Eyes: Negative.   Respiratory: Positive for shortness of  breath.        Assoc with chest tightness on exertion  Cardiovascular: Positive for chest pain. Negative for palpitations, orthopnea, leg swelling and PND.  Gastrointestinal: Negative.   Genitourinary: Negative.   Musculoskeletal: Positive for myalgias.       On statins  Skin: Negative.   Neurological: Positive for tremors.       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   Psychiatric/Behavioral: Negative.    Blood pressure 165/91, pulse 58, temperature 98.2 F (36.8 C), temperature source Oral, resp. rate 18, height 5\' 10"  (1.778 m), weight 113.2 kg (249 lb 9 oz), SpO2 94.00%. Physical Exam  Constitutional: He is oriented to person, place, and time. He appears well-developed and well-nourished. No distress.  HENT:  Head: Normocephalic and atraumatic.  Mouth/Throat: Oropharynx is clear and moist.  Eyes: Conjunctivae and EOM are normal. Pupils are equal, round, and reactive to light.  Neck: Normal range of motion. Neck supple. No JVD present. No thyromegaly present.  Cardiovascular: Normal rate, regular rhythm, normal heart sounds and intact distal pulses.  Exam reveals no gallop and no friction rub.   No murmur heard. Respiratory: Effort normal and breath sounds normal. No respiratory distress. He has no rales. He exhibits no tenderness.  GI: Soft. Bowel sounds are normal. He exhibits no distension and no mass. There is no tenderness.  Musculoskeletal: Normal range of motion. He exhibits no edema and no tenderness.  Lymphadenopathy:    He has no cervical adenopathy.  Neurological: He is alert and oriented to person, place, and time. He has normal strength. No cranial nerve deficit or sensory deficit.  tremor  Skin: Skin is warm and dry.  Psychiatric: He has a normal mood and affect.    PROCEDURE: Left heart catheterization with selective coronary angiography, left ventriculogram.  INDICATIONS: Class III angina  The risks, benefits, and details  of the procedure were explained to the patient. The patient verbalized understanding and wanted to proceed. Informed written consent was obtained.  PROCEDURE TECHNIQUE: After Xylocaine anesthesia a 31F slender sheath was placed in the right radial artery with a single anterior needle wall stick. Right coronary angiography was done using a Judkins R4 guide catheter. Left coronary angiography was done using a Judkins L3.5 guide catheter. Left ventriculography was done using a pigtail catheter. A TR band was used for hemostasis.  CONTRAST: Total of 90 cc.  COMPLICATIONS: None.  HEMODYNAMICS: Aortic pressure was 109/57; LV pressure was 109/4; LVEDP 11. There was no gradient between the left ventricle and aorta.  ANGIOGRAPHIC DATA: The left main coronary  artery is widely patent.  The left anterior descending artery is a large vessel. The midportion has moderate diffuse disease followed by a focal 90% lesion. The mid to distal LAD is medium size and caliber but appears widely patent. It is fairly tortuous. There is a large diagonal branch which appears widely patent. There is a large septal arcade which is patent as well. The LAD appears to give collaterals to the PDA.  The left circumflex artery is a large vessel. There is a large first obtuse marginal with an ostial 70% stenosis. Just after the marginal branch, there is a 99% stenosis in the midcircumflex. The OM 2 is medium size and patent..  The right coronary artery is a large dominant vessel. There is a 50% ostial stenosis. There are sequential 90% stenoses in the proximal to mid vessel. In the distal vessel, there is a subtotal occlusion. The distal RCA fills antegrade as well as from left to right collaterals. Competitive flow is evident. The PDA appears to be a reasonably sized vessel.  LEFT VENTRICULOGRAM: Left ventricular angiogram was done in the 30 RAO projection and revealed normal left ventricular wall motion and systolic function with an estimated  ejection fraction of 60%. LVEDP was 11 mmHg.  IMPRESSIONS:  1. Normal left main coronary artery. 2. 80-90% mid left anterior descending artery lesion. 3. 99% lesion in the mid left circumflex artery. 70% ostial OM1 stenosis. 4. Ostial 50% stenosis right coronary artery. Sequential 99% stenosis in the mid RCA. Subtotal occluion of the distal RCA with left to right collaterals. 5. Normal left ventricular systolic function. LVEDP 11 mmHg. Ejection fraction 55%. RECOMMENDATION: Severe three-vessel coronary artery disease. Will plan for cardiothoracic surgery consultation. The findings were explained to the patient and his wife.    Assessment/Plan:  He has severe multi-vessel coronary disease with high grade stenoses. I agree with need for CABG. He has been on Plavix chronically for his prior stroke. This has been stopped. I discussed the operative procedure with the patient and wife including alternatives, benefits and risks; including but not limited to bleeding, blood transfusion, infection, stroke, myocardial infarction, graft failure, heart block requiring a permanent pacemaker, organ dysfunction, and death.  Jeannette Corpus understands and agrees to proceed.  We will schedule surgery for Monday 06/21/2013.  BARTLE,BRYAN K 06/17/2013, 8:32 AM

## 2013-06-17 NOTE — Progress Notes (Signed)
SUBJECTIVE:  No CP.  No bleeding issues.  OBJECTIVE:   Vitals:   Filed Vitals:   06/17/13 0008 06/17/13 0600 06/17/13 0807 06/17/13 1320  BP: 139/55 150/75 165/91 113/79  Pulse: 51 57 58 61  Temp: 97.9 F (36.6 C) 98.4 F (36.9 C) 98.2 F (36.8 C) 98.1 F (36.7 C)  TempSrc: Oral Oral Oral Oral  Resp: 18 20 18 18   Height:      Weight: 249 lb 9 oz (113.2 kg)     SpO2: 94% 92% 94% 96%   I&O's:   Intake/Output Summary (Last 24 hours) at 06/17/13 1504 Last data filed at 06/17/13 0700  Gross per 24 hour  Intake    480 ml  Output      0 ml  Net    480 ml   TELEMETRY: Reviewed telemetry pt in NSR:     PHYSICAL EXAM General: Well developed, well nourished, in no acute distress Head: Marland Kitchen   Normal cephalic and atramatic  Lungs:   Clear bilaterally to auscultation and percussion. Heart:   HRRR S1 S2  Abdomen:  abdomen soft and non-tender Msk:  Back normal, normal gait. Normal strength and tone for age. Extremities:  No  edema.  Right radial pulse 2+ Neuro: Alert and oriented X 3. Psych:  Good affect, responds appropriately   LABS: Basic Metabolic Panel:  Recent Labs  16/10/96 1508  NA 140  K 3.7  CL 100  CO2 32  GLUCOSE 103*  BUN 11  CREATININE 0.9  CALCIUM 9.4   Liver Function Tests: No results found for this basename: AST, ALT, ALKPHOS, BILITOT, PROT, ALBUMIN,  in the last 72 hours No results found for this basename: LIPASE, AMYLASE,  in the last 72 hours CBC:  Recent Labs  06/14/13 1508  WBC 8.0  NEUTROABS 4.7  HGB 15.2  HCT 44.3  MCV 91.5  PLT 223.0   Cardiac Enzymes: No results found for this basename: CKTOTAL, CKMB, CKMBINDEX, TROPONINI,  in the last 72 hours BNP: No components found with this basename: POCBNP,  D-Dimer: No results found for this basename: DDIMER,  in the last 72 hours Hemoglobin A1C: No results found for this basename: HGBA1C,  in the last 72 hours Fasting Lipid Panel: No results found for this basename: CHOL, HDL, LDLCALC,  TRIG, CHOLHDL, LDLDIRECT,  in the last 72 hours Thyroid Function Tests: No results found for this basename: TSH, T4TOTAL, FREET3, T3FREE, THYROIDAB,  in the last 72 hours Anemia Panel: No results found for this basename: VITAMINB12, FOLATE, FERRITIN, TIBC, IRON, RETICCTPCT,  in the last 72 hours Coag Panel:   Lab Results  Component Value Date   INR 1.1* 06/14/2013    RADIOLOGY: No results found.     ASSESSMENT: Severe 3 vessel CAD, PAD  PLAN:  Plan for CABG on Monday with Dr. Laneta Simmers.  i think it is safer for him to stay in house, on heparin while awaiting CABG and waiting for PLavix to washout of his system.  He has a 99% circ lesion and an occluded RCA, dependent on collaterals from a heavily disease LAD.  Start IV heparin.   Continue beta blocker and Imdur as well.  Discussed with patient and wife and he understands.      Corky Crafts., MD  06/17/2013  3:04 PM

## 2013-06-18 LAB — URINALYSIS, ROUTINE W REFLEX MICROSCOPIC
Bilirubin Urine: NEGATIVE
Glucose, UA: NEGATIVE mg/dL
Hgb urine dipstick: NEGATIVE
Ketones, ur: NEGATIVE mg/dL
Nitrite: NEGATIVE
Protein, ur: NEGATIVE mg/dL
Specific Gravity, Urine: 1.01 (ref 1.005–1.030)
Urobilinogen, UA: 0.2 mg/dL (ref 0.0–1.0)
pH: 6.5 (ref 5.0–8.0)

## 2013-06-18 LAB — HEPARIN LEVEL (UNFRACTIONATED): Heparin Unfractionated: 0.31 IU/mL (ref 0.30–0.70)

## 2013-06-18 LAB — SURGICAL PCR SCREEN
MRSA, PCR: NEGATIVE
Staphylococcus aureus: NEGATIVE

## 2013-06-18 LAB — CBC
Hemoglobin: 13.9 g/dL (ref 13.0–17.0)
MCH: 32.3 pg (ref 26.0–34.0)
RBC: 4.3 MIL/uL (ref 4.22–5.81)

## 2013-06-18 NOTE — Progress Notes (Signed)
0830 Pt up walking independently. Using IS also. Pt states no CP or SOB with walks. Since pre op ed done, we will follow up after surgery. Duanne Limerick, RN BSN   06/18/2013 8:31 AM

## 2013-06-18 NOTE — Progress Notes (Signed)
SUBJECTIVE:  One episode of CP after his first walk yesterday, resolved with rest.  No bleeding issues on heparin.  OBJECTIVE:   Vitals:   Filed Vitals:   06/17/13 1320 06/17/13 1621 06/17/13 2009 06/18/13 0758  BP: 113/79 145/84 130/83 133/104  Pulse: 61 61 68 56  Temp: 98.1 F (36.7 C) 98.2 F (36.8 C) 98.8 F (37.1 C) 98 F (36.7 C)  TempSrc: Oral Oral Oral Oral  Resp: 18 18 18 18   Height:      Weight:      SpO2: 96% 96% 96% 96%   I&O's:    Intake/Output Summary (Last 24 hours) at 06/18/13 0910 Last data filed at 06/17/13 2200  Gross per 24 hour  Intake 876.18 ml  Output      0 ml  Net 876.18 ml   TELEMETRY: Reviewed telemetry pt in NSR:     PHYSICAL EXAM General: Well developed, well nourished, in no acute distress Head: Marland Kitchen   Normal cephalic and atramatic  Lungs:   Clear bilaterally to auscultation and percussion. Heart:   HRRR S1 S2  Abdomen:  abdomen soft and non-tender Msk:  Back normal, normal gait. Normal strength and tone for age. Extremities:  No  edema.  Right radial pulse 2+ Neuro: Alert and oriented X 3. Psych:  Good affect, responds appropriately   LABS: Basic Metabolic Panel: No results found for this basename: NA, K, CL, CO2, GLUCOSE, BUN, CREATININE, CALCIUM, MG, PHOS,  in the last 72 hours Liver Function Tests: No results found for this basename: AST, ALT, ALKPHOS, BILITOT, PROT, ALBUMIN,  in the last 72 hours No results found for this basename: LIPASE, AMYLASE,  in the last 72 hours CBC:  Recent Labs  06/18/13 0100  WBC 7.9  HGB 13.9  HCT 38.7*  MCV 90.0  PLT 187   Cardiac Enzymes: No results found for this basename: CKTOTAL, CKMB, CKMBINDEX, TROPONINI,  in the last 72 hours BNP: No components found with this basename: POCBNP,  D-Dimer: No results found for this basename: DDIMER,  in the last 72 hours Hemoglobin A1C: No results found for this basename: HGBA1C,  in the last 72 hours Fasting Lipid Panel: No results found for this  basename: CHOL, HDL, LDLCALC, TRIG, CHOLHDL, LDLDIRECT,  in the last 72 hours Thyroid Function Tests: No results found for this basename: TSH, T4TOTAL, FREET3, T3FREE, THYROIDAB,  in the last 72 hours Anemia Panel: No results found for this basename: VITAMINB12, FOLATE, FERRITIN, TIBC, IRON, RETICCTPCT,  in the last 72 hours Coag Panel:   Lab Results  Component Value Date   INR 1.1* 06/14/2013    RADIOLOGY: No results found.     ASSESSMENT: Severe 3 vessel CAD, PAD  PLAN:  Plan for CABG on Monday with Dr. Laneta Simmers.  i think it is safer for him to stay in house, on heparin while awaiting CABG and waiting for PLavix to washout of his system, especially given that he had CP while walking in the hospital.  He has a 99% circ lesion and an occluded RCA, dependent on collaterals from a heavily disease LAD.  Start IV heparin.   Continue beta blocker and Imdur as well.  Discussed with patient and wife and he understands.  Will inform Dr. Docia Chuck.     Corky Crafts., MD  06/18/2013  9:10 AM

## 2013-06-18 NOTE — Progress Notes (Signed)
PHARMACY FOLLOW UP NOTE   Pharmacy Consult for : Heparin Bridging Indication: Anticoagulation bridging until CABG 06/21/13  Dosing Weight: 98 kg  Labs:  Recent Labs  06/17/13 1640 06/18/13 0100 06/18/13 0840  HGB  --  13.9  --   HCT  --  38.7*  --   PLT  --  187  --   HEPARINUNFRC 0.11* 0.34 0.31   Estimated Creatinine Clearance: 96.3 ml/min (by C-G formula based on Cr of 0.9).  Pertinent Medications:  Infusions:  . heparin 1,600 Units/hr (06/17/13 1807)   Assessment: 72 yo male with severe multivessel CAD.  On heparin bridging until CABG 06/21/13.  Heparin infusing at 1600 units/hr.  Heparin level therapeutic 0.31 units/ml. No bleeding problems noted.   Goal:  Heparin level 0.3-0.7 units/ml   Plan: 1. Continue Heparin at 1600 units/hr. 2. Daily Heparin Levels, CBC.  Monitor for bleeding complications    Rayford Halsted.D 06/18/2013, 1:23 PM

## 2013-06-18 NOTE — Progress Notes (Signed)
06/18/13  Pharmacy- Heparin 01:45  Heparin level 0.34  A/P:  72yo male with severe multivessel CAD, for CABG 06/21/13.  Heparin level therapeutic on 1600 units/hr.  No bleeding problems noted.    1.  Cont heparin at 1600 units/hr 2.  Check heparin level 6hr to confirm  Talbert Cage, PharmD Clinical Pharmacist

## 2013-06-19 LAB — CBC
Hemoglobin: 13.9 g/dL (ref 13.0–17.0)
MCH: 32.5 pg (ref 26.0–34.0)
MCHC: 36.1 g/dL — ABNORMAL HIGH (ref 30.0–36.0)
MCV: 90 fL (ref 78.0–100.0)
Platelets: 182 10*3/uL (ref 150–400)
RDW: 12.7 % (ref 11.5–15.5)

## 2013-06-19 LAB — HEPARIN LEVEL (UNFRACTIONATED)
Heparin Unfractionated: 0.25 IU/mL — ABNORMAL LOW (ref 0.30–0.70)
Heparin Unfractionated: 0.31 IU/mL (ref 0.30–0.70)

## 2013-06-19 MED ORDER — FLUTICASONE PROPIONATE 50 MCG/ACT NA SUSP
1.0000 | Freq: Every day | NASAL | Status: DC
  Administered 2013-06-19 – 2013-06-25 (×7): 1 via NASAL
  Filled 2013-06-19 (×3): qty 16

## 2013-06-19 MED ORDER — PROPRANOLOL HCL 20 MG PO TABS
20.0000 mg | ORAL_TABLET | Freq: Two times a day (BID) | ORAL | Status: DC
  Administered 2013-06-19 – 2013-06-20 (×3): 20 mg via ORAL
  Filled 2013-06-19 (×6): qty 1

## 2013-06-19 MED ORDER — SIMVASTATIN 10 MG PO TABS
10.0000 mg | ORAL_TABLET | Freq: Every day | ORAL | Status: DC
  Administered 2013-06-19 – 2013-06-25 (×6): 10 mg via ORAL
  Filled 2013-06-19 (×10): qty 1

## 2013-06-19 MED ORDER — HYDROXYZINE HCL 10 MG PO TABS
10.0000 mg | ORAL_TABLET | Freq: Two times a day (BID) | ORAL | Status: DC
  Administered 2013-06-19 – 2013-06-20 (×3): 10 mg via ORAL
  Filled 2013-06-19 (×6): qty 1

## 2013-06-19 MED ORDER — FA-PYRIDOXINE-CYANOCOBALAMIN 2.5-25-2 MG PO TABS
1.0000 | ORAL_TABLET | Freq: Every day | ORAL | Status: DC
  Administered 2013-06-19 – 2013-06-20 (×2): 1 via ORAL
  Filled 2013-06-19 (×3): qty 1

## 2013-06-19 NOTE — Progress Notes (Signed)
Subjective:  NO chest pain since yesterday. Not SOB.  Objective:  Vital Signs in the last 24 hours: BP 149/75  Pulse 56  Temp(Src) 98.3 F (36.8 C) (Oral)  Resp 18  Ht 5\' 10"  (1.778 m)  Wt 113.2 kg (249 lb 9 oz)  BMI 35.81 kg/m2  SpO2 97%  Physical Exam: Pleasant WM in NAD Lungs:  Clear  Cardiac:  Regular rhythm, normal S1 and S2, no S3  Intake/Output from previous day: 10/17 0701 - 10/18 0700 In: 1301.5 [P.O.:900; I.V.:401.5] Out: 700 [Urine:700] Weight Filed Weights   06/16/13 0703 06/17/13 0008  Weight: 112.946 kg (249 lb) 113.2 kg (249 lb 9 oz)    Lab Results: Basic Metabolic Panel: No results found for this basename: NA, K, CL, CO2, GLUCOSE, BUN, CREATININE,  in the last 72 hours  CBC:  Recent Labs  06/18/13 0100 06/19/13 0410  WBC 7.9 7.2  HGB 13.9 13.9  HCT 38.7* 38.5*  MCV 90.0 90.0  PLT 187 182    Telemetry: Sinus rhythm  Assessment/Plan:  1. UAP 2. CAD 3 vessel for CABG  Rec:  Continue heparin and awaiting CABG on Monday.      Darden Palmer  MD [facility]  06/19/2013, 9:43 AM

## 2013-06-19 NOTE — Progress Notes (Signed)
06/19/13 Patient has the [facility] patient education booklet, has been instructed and is using IS, and he and his wife have seen the open heart surgery videos. Will continue to monitor patient. Venancio Chenier, Randall An RN

## 2013-06-19 NOTE — Progress Notes (Signed)
06/19/13 Nursing note  Patient ambulating in hallway independently without complaints. Will continue to monitor. Caeli Linehan, Randall An  rN

## 2013-06-19 NOTE — Progress Notes (Signed)
06/19/13  Pharmacy- Heparin 05:25  Heparin level 0.25  A/P:  72yo male with severe multivessel CAD, for CABG 06/21/13.  Heparin level subtherapeutic on 1600 units/hr.  No bleeding problems noted.    1.  Increase heparin to 1750 units/hr 2.  Check heparin level 6hr to confirm  Talbert Cage, PharmD Clinical Pharmacist

## 2013-06-19 NOTE — Progress Notes (Signed)
ANTICOAGULATION CONSULT NOTE - Follow Up Consult  Pharmacy Consult for heparin Indication: Anticoagulation bridging until CABG 06/21/13  Allergies  Allergen Reactions  . Penicillins   . Mysoline [Primidone]   . Codeine Other (See Comments)    hallucinate    Patient Measurements: Height: 5\' 10"  (177.8 cm) Weight: 249 lb 9 oz (113.2 kg) IBW/kg (Calculated) : 73 Heparin Dosing Weight: 98kg  Vital Signs: Temp: 98.3 F (36.8 C) (10/18 0914) Temp src: Oral (10/18 0914) BP: 149/75 mmHg (10/18 0914) Pulse Rate: 56 (10/18 0914)  Labs:  Recent Labs  06/18/13 0100 06/18/13 0840 06/19/13 0410  HGB 13.9  --  13.9  HCT 38.7*  --  38.5*  PLT 187  --  182  HEPARINUNFRC 0.34 0.31 0.25*    Estimated Creatinine Clearance: 96.3 ml/min (by C-G formula based on Cr of 0.9).  Assessment: Patient is a 72 y.o M s/p cath with noted 3 vessel CAD with plan for CABG on Monday.  Heparin level is therapeutic (0.31) but is at lower end of therapeutic range. No bleeding documented.  Goal of Therapy:  Heparin level 0.3-0.7 units/ml Monitor platelets by anticoagulation protocol: Yes   Plan:  1) increase heparin drip slightly to 1800 units/hr  Hadassa Cermak P 06/19/2013,11:26 AM

## 2013-06-19 NOTE — Progress Notes (Signed)
Patient assesment consistent with yesterday and last nite's.  Report called to unit 2W.  Patient transported to room 2W07 by wheelchair.

## 2013-06-20 ENCOUNTER — Inpatient Hospital Stay (HOSPITAL_COMMUNITY): Payer: Medicare Other

## 2013-06-20 DIAGNOSIS — I2510 Atherosclerotic heart disease of native coronary artery without angina pectoris: Secondary | ICD-10-CM

## 2013-06-20 LAB — HEPARIN LEVEL (UNFRACTIONATED): Heparin Unfractionated: 0.34 IU/mL (ref 0.30–0.70)

## 2013-06-20 LAB — BLOOD GAS, ARTERIAL
Bicarbonate: 24.8 mEq/L — ABNORMAL HIGH (ref 20.0–24.0)
FIO2: 0.21 %
O2 Saturation: 97.3 %
Patient temperature: 98.6
TCO2: 26 mmol/L (ref 0–100)
pH, Arterial: 7.427 (ref 7.350–7.450)
pO2, Arterial: 79.2 mmHg — ABNORMAL LOW (ref 80.0–100.0)

## 2013-06-20 LAB — CBC
MCH: 31.1 pg (ref 26.0–34.0)
MCHC: 34.2 g/dL (ref 30.0–36.0)
MCV: 90.8 fL (ref 78.0–100.0)
Platelets: 175 10*3/uL (ref 150–400)
RBC: 4.25 MIL/uL (ref 4.22–5.81)
RDW: 12.7 % (ref 11.5–15.5)

## 2013-06-20 LAB — TYPE AND SCREEN: Antibody Screen: NEGATIVE

## 2013-06-20 LAB — HEMOGLOBIN A1C: Mean Plasma Glucose: 117 mg/dL — ABNORMAL HIGH (ref <117)

## 2013-06-20 MED ORDER — CHLORHEXIDINE GLUCONATE 4 % EX LIQD
60.0000 mL | Freq: Once | CUTANEOUS | Status: AC
  Administered 2013-06-21: 4 via TOPICAL
  Filled 2013-06-20 (×2): qty 60

## 2013-06-20 MED ORDER — PHENYLEPHRINE HCL 10 MG/ML IJ SOLN
30.0000 ug/min | INTRAVENOUS | Status: AC
  Administered 2013-06-21: 25 ug/min via INTRAVENOUS
  Filled 2013-06-20: qty 2

## 2013-06-20 MED ORDER — TEMAZEPAM 15 MG PO CAPS
15.0000 mg | ORAL_CAPSULE | Freq: Once | ORAL | Status: AC | PRN
  Administered 2013-06-20: 15 mg via ORAL
  Filled 2013-06-20: qty 1

## 2013-06-20 MED ORDER — EPINEPHRINE HCL 1 MG/ML IJ SOLN
0.5000 ug/min | INTRAMUSCULAR | Status: DC
  Filled 2013-06-20: qty 4

## 2013-06-20 MED ORDER — CHLORHEXIDINE GLUCONATE 4 % EX LIQD
60.0000 mL | Freq: Once | CUTANEOUS | Status: AC
  Administered 2013-06-20: 4 via TOPICAL
  Filled 2013-06-20: qty 60

## 2013-06-20 MED ORDER — PLASMA-LYTE 148 IV SOLN
INTRAVENOUS | Status: AC
  Administered 2013-06-21: 09:00:00
  Filled 2013-06-20: qty 2.5

## 2013-06-20 MED ORDER — MAGNESIUM SULFATE 50 % IJ SOLN
40.0000 meq | INTRAMUSCULAR | Status: DC
  Filled 2013-06-20 (×2): qty 10

## 2013-06-20 MED ORDER — SODIUM CHLORIDE 0.9 % IV SOLN
INTRAVENOUS | Status: AC
  Administered 2013-06-21: 1.8 [IU]/h via INTRAVENOUS
  Filled 2013-06-20: qty 1

## 2013-06-20 MED ORDER — VANCOMYCIN HCL 10 G IV SOLR
1500.0000 mg | INTRAVENOUS | Status: AC
  Administered 2013-06-21: 1000 mg via INTRAVENOUS
  Filled 2013-06-20: qty 1500

## 2013-06-20 MED ORDER — NITROGLYCERIN IN D5W 200-5 MCG/ML-% IV SOLN
2.0000 ug/min | INTRAVENOUS | Status: AC
  Administered 2013-06-21: 5 ug/min via INTRAVENOUS
  Filled 2013-06-20: qty 250

## 2013-06-20 MED ORDER — DEXMEDETOMIDINE HCL IN NACL 400 MCG/100ML IV SOLN
0.1000 ug/kg/h | INTRAVENOUS | Status: AC
  Administered 2013-06-21: 0.3 ug/kg/h via INTRAVENOUS
  Filled 2013-06-20: qty 100

## 2013-06-20 MED ORDER — POTASSIUM CHLORIDE 2 MEQ/ML IV SOLN
80.0000 meq | INTRAVENOUS | Status: DC
  Filled 2013-06-20: qty 40

## 2013-06-20 MED ORDER — SODIUM CHLORIDE 0.9 % IV SOLN
INTRAVENOUS | Status: DC
  Filled 2013-06-20: qty 30

## 2013-06-20 MED ORDER — DOPAMINE-DEXTROSE 3.2-5 MG/ML-% IV SOLN
2.0000 ug/kg/min | INTRAVENOUS | Status: DC
  Filled 2013-06-20: qty 250

## 2013-06-20 MED ORDER — LEVOFLOXACIN IN D5W 500 MG/100ML IV SOLN
500.0000 mg | INTRAVENOUS | Status: AC
  Administered 2013-06-21: 500 mg via INTRAVENOUS
  Filled 2013-06-20: qty 100

## 2013-06-20 MED ORDER — METOPROLOL TARTRATE 12.5 MG HALF TABLET
12.5000 mg | ORAL_TABLET | Freq: Once | ORAL | Status: AC
Start: 2013-06-21 — End: 2013-06-21
  Administered 2013-06-21: 12.5 mg via ORAL
  Filled 2013-06-20: qty 1

## 2013-06-20 MED ORDER — SODIUM CHLORIDE 0.9 % IV SOLN
INTRAVENOUS | Status: AC
  Administered 2013-06-21: 70 mL/h via INTRAVENOUS
  Filled 2013-06-20: qty 40

## 2013-06-20 MED ORDER — ALPRAZOLAM 0.25 MG PO TABS: 0.2500 mg | ORAL_TABLET | ORAL | Status: DC | PRN

## 2013-06-20 MED ORDER — BISACODYL 5 MG PO TBEC
5.0000 mg | DELAYED_RELEASE_TABLET | Freq: Once | ORAL | Status: AC
  Administered 2013-06-20: 5 mg via ORAL
  Filled 2013-06-20: qty 1

## 2013-06-20 MED ORDER — DIAZEPAM 5 MG PO TABS
5.0000 mg | ORAL_TABLET | Freq: Once | ORAL | Status: AC
  Administered 2013-06-21: 5 mg via ORAL
  Filled 2013-06-20: qty 1

## 2013-06-20 NOTE — Progress Notes (Signed)
4 Days Post-Op Procedure(s) (LRB): LEFT HEART CATHETERIZATION WITH CORONARY ANGIOGRAM (N/A) Subjective: No chest pain  Objective: Vital signs in last 24 hours: Temp:  [98.3 F (36.8 C)-98.7 F (37.1 C)] 98.3 F (36.8 C) (10/19 0448) Pulse Rate:  [46-50] 46 (10/19 0448) Cardiac Rhythm:  [-] Sinus bradycardia (10/19 0741) Resp:  [18] 18 (10/19 0448) BP: (105-148)/(57-78) 148/78 mmHg (10/19 0931) SpO2:  [94 %-97 %] 94 % (10/19 0448)  Hemodynamic parameters for last 24 hours:    Intake/Output from previous day: 10/18 0701 - 10/19 0700 In: 543 [P.O.:240; I.V.:303] Out: -  Intake/Output this shift: Total I/O In: 320.7 [P.O.:240; I.V.:80.7] Out: -   General appearance: alert and cooperative Heart: regular rate and rhythm, S1, S2 normal, no murmur, click, rub or gallop Lungs: clear to auscultation bilaterally  Lab Results:  Recent Labs  06/19/13 0410 06/20/13 0515  WBC 7.2 8.0  HGB 13.9 13.2  HCT 38.5* 38.6*  PLT 182 175   BMET: No results found for this basename: NA, K, CL, CO2, GLUCOSE, BUN, CREATININE, CALCIUM,  in the last 72 hours  PT/INR: No results found for this basename: LABPROT, INR,  in the last 72 hours ABG No results found for this basename: phart, pco2, po2, hco3, tco2, acidbasedef, o2sat   CBG (last 3)  No results found for this basename: GLUCAP,  in the last 72 hours  Assessment/Plan: S/P Procedure(s) (LRB): LEFT HEART CATHETERIZATION WITH CORONARY ANGIOGRAM (N/A)  Stable for CABG in the am. I discussed the operative procedure with the patient and family including alternatives, benefits and risks; including but not limited to bleeding, blood transfusion, infection, stroke, myocardial infarction, graft failure, heart block requiring a permanent pacemaker, organ dysfunction, and death.  Dale Lynch understands and agrees to proceed.   LOS: 4 days    BARTLE,BRYAN K 06/20/2013

## 2013-06-20 NOTE — Progress Notes (Signed)
Subjective:  NO chest pain since yesterday. Not SOB. Some bradycardia overnight  Objective:  Vital Signs in the last 24 hours: BP 148/78  Pulse 46  Temp(Src) 98.3 F (36.8 C) (Oral)  Resp 18  Ht 5\' 10"  (1.778 m)  Wt 113.2 kg (249 lb 9 oz)  BMI 35.81 kg/m2  SpO2 94%  Physical Exam: Pleasant WM in NAD Lungs:  Clear  Cardiac:  Regular rhythm, normal S1 and S2, no S3  Intake/Output from previous day: 10/18 0701 - 10/19 0700 In: 543 [P.O.:240; I.V.:303] Out: -  Weight Filed Weights   06/16/13 0703 06/17/13 0008  Weight: 112.946 kg (249 lb) 113.2 kg (249 lb 9 oz)    Lab Results:  CBC:  Recent Labs  06/19/13 0410 06/20/13 0515  WBC 7.2 8.0  HGB 13.9 13.2  HCT 38.5* 38.6*  MCV 90.0 90.8  PLT 182 175    Telemetry: Sinus rhythm, some bradycardia throughout the night when he was sleeping  Assessment/Plan:  1. UAP-stable for surgery  2. CAD 3 vessel for CABG  Rec:  Continue heparin and awaiting CABG on Monday. Questions answered regarding bypass      W. Ashley Royalty  MD [facility]  06/20/2013, 10:38 AM

## 2013-06-20 NOTE — Progress Notes (Signed)
ANTICOAGULATION CONSULT NOTE - Follow Up Consult  Pharmacy Consult for heparin Indication: Anticoagulation bridging until CABG 06/21/13   Allergies  Allergen Reactions  . Penicillins   . Mysoline [Primidone]   . Codeine Other (See Comments)    hallucinate    Patient Measurements: Height: 5\' 10"  (177.8 cm) Weight: 249 lb 9 oz (113.2 kg) IBW/kg (Calculated) : 73 Heparin Dosing Weight: 98 kg  Vital Signs: Temp: 98.3 F (36.8 C) (10/19 0448) Temp src: Oral (10/19 0448) BP: 148/78 mmHg (10/19 0931) Pulse Rate: 46 (10/19 0448)  Labs:  Recent Labs  06/18/13 0100  06/19/13 0410 06/19/13 1255 06/20/13 0515  HGB 13.9  --  13.9  --  13.2  HCT 38.7*  --  38.5*  --  38.6*  PLT 187  --  182  --  175  HEPARINUNFRC 0.34  < > 0.25* 0.31 0.34  < > = values in this interval not displayed.  Estimated Creatinine Clearance: 96.3 ml/min (by C-G formula based on Cr of 0.9).  Assessment: Patient is a 72 y.o M on heparin for CAD with plan for CABG tomorrow.  Heparin level remains at goal with 0.34 this morning.  No bleeding documented.  Goal of Therapy:  Heparin level 0.3-0.7 units/ml Monitor platelets by anticoagulation protocol: Yes   Plan:  1)  continue heparin at 1800 units/hr 2)  f/u with patient after procedure tomorrow   Alfons Sulkowski P 06/20/2013,10:40 AM

## 2013-06-20 NOTE — Progress Notes (Signed)
Patient's heart rate dropped periodically throughout the night below 40.  The lowest it went to was 30.  Patient was easily arousable each time I checked on him.  Placed him on 2LO2 nasal cannula during the night. Will continue to monitor.

## 2013-06-21 ENCOUNTER — Inpatient Hospital Stay (HOSPITAL_COMMUNITY): Payer: Medicare Other

## 2013-06-21 ENCOUNTER — Inpatient Hospital Stay (HOSPITAL_COMMUNITY): Payer: Medicare Other | Admitting: Certified Registered Nurse Anesthetist

## 2013-06-21 ENCOUNTER — Encounter (HOSPITAL_COMMUNITY)
Admission: RE | Disposition: A | Discharge status: Home / Self Care | Payer: Medicare Other | Source: Ambulatory Visit | Attending: Interventional Cardiology

## 2013-06-21 ENCOUNTER — Encounter (HOSPITAL_COMMUNITY): Payer: Medicare Other | Admitting: Certified Registered Nurse Anesthetist

## 2013-06-21 ENCOUNTER — Encounter (HOSPITAL_COMMUNITY): Payer: Self-pay | Admitting: Certified Registered Nurse Anesthetist

## 2013-06-21 DIAGNOSIS — I2510 Atherosclerotic heart disease of native coronary artery without angina pectoris: Secondary | ICD-10-CM

## 2013-06-21 HISTORY — PX: INTRAOPERATIVE TRANSESOPHAGEAL ECHOCARDIOGRAM: SHX5062

## 2013-06-21 HISTORY — PX: CORONARY ARTERY BYPASS GRAFT: SHX141

## 2013-06-21 LAB — POCT I-STAT 3, ART BLOOD GAS (G3+)
Acid-Base Excess: 2 mmol/L (ref 0.0–2.0)
Acid-base deficit: 2 mmol/L (ref 0.0–2.0)
Acid-base deficit: 2 mmol/L (ref 0.0–2.0)
Bicarbonate: 27.3 mEq/L — ABNORMAL HIGH (ref 20.0–24.0)
O2 Saturation: 100 %
O2 Saturation: 93 %
Patient temperature: 37.1
Patient temperature: 37.7
TCO2: 29 mmol/L (ref 0–100)
pCO2 arterial: 45.9 mmHg — ABNORMAL HIGH (ref 35.0–45.0)
pCO2 arterial: 34.3 mmHg — ABNORMAL LOW (ref 35.0–45.0)
pCO2 arterial: 41.7 mmHg (ref 35.0–45.0)
pH, Arterial: 7.426 (ref 7.350–7.450)
pH, Arterial: 7.342 — ABNORMAL LOW (ref 7.350–7.450)
pO2, Arterial: 62 mmHg — ABNORMAL LOW (ref 80.0–100.0)

## 2013-06-21 LAB — CBC
HCT: 35.1 % — ABNORMAL LOW (ref 39.0–52.0)
HCT: 37.6 % — ABNORMAL LOW (ref 39.0–52.0)
Hemoglobin: 15.2 g/dL (ref 13.0–17.0)
MCHC: 35.4 g/dL (ref 30.0–36.0)
MCV: 88.6 fL (ref 78.0–100.0)
Platelets: 218 10*3/uL (ref 150–400)
Platelets: 129 10*3/uL — ABNORMAL LOW (ref 150–400)
Platelets: 145 10*3/uL — ABNORMAL LOW (ref 150–400)
RBC: 4.87 MIL/uL (ref 4.22–5.81)
RBC: 3.96 MIL/uL — ABNORMAL LOW (ref 4.22–5.81)
RDW: 12.4 % (ref 11.5–15.5)
RDW: 12.3 % (ref 11.5–15.5)
WBC: 8.4 10*3/uL (ref 4.0–10.5)
WBC: 12.8 10*3/uL — ABNORMAL HIGH (ref 4.0–10.5)
WBC: 13.7 10*3/uL — ABNORMAL HIGH (ref 4.0–10.5)

## 2013-06-21 LAB — CREATININE, SERUM
Creatinine, Ser: 0.88 mg/dL (ref 0.50–1.35)
GFR calc non Af Amer: 85 mL/min — ABNORMAL LOW (ref >90)

## 2013-06-21 LAB — POCT I-STAT 4, (NA,K, GLUC, HGB,HCT)
Glucose, Bld: 120 mg/dL — ABNORMAL HIGH (ref 70–99)
Glucose, Bld: 100 mg/dL — ABNORMAL HIGH (ref 70–99)
Glucose, Bld: 123 mg/dL — ABNORMAL HIGH (ref 70–99)
HCT: 36 % — ABNORMAL LOW (ref 39.0–52.0)
HCT: 30 % — ABNORMAL LOW (ref 39.0–52.0)
HCT: 31 % — ABNORMAL LOW (ref 39.0–52.0)
HCT: 33 % — ABNORMAL LOW (ref 39.0–52.0)
HCT: 35 % — ABNORMAL LOW (ref 39.0–52.0)
Hemoglobin: 12.9 g/dL — ABNORMAL LOW (ref 13.0–17.0)
Hemoglobin: 12.2 g/dL — ABNORMAL LOW (ref 13.0–17.0)
Hemoglobin: 11.9 g/dL — ABNORMAL LOW (ref 13.0–17.0)
Potassium: 3.6 mEq/L (ref 3.5–5.1)
Potassium: 3.7 mEq/L (ref 3.5–5.1)
Potassium: 4.2 mEq/L (ref 3.5–5.1)
Sodium: 138 mEq/L (ref 135–145)
Sodium: 139 mEq/L (ref 135–145)
Sodium: 135 mEq/L (ref 135–145)
Sodium: 136 mEq/L (ref 135–145)

## 2013-06-21 LAB — COMPREHENSIVE METABOLIC PANEL
ALT: 100 U/L — ABNORMAL HIGH (ref 0–53)
AST: 73 U/L — ABNORMAL HIGH (ref 0–37)
Albumin: 4.3 g/dL (ref 3.5–5.2)
Alkaline Phosphatase: 82 U/L (ref 39–117)
CO2: 27 mEq/L (ref 19–32)
Chloride: 99 mEq/L (ref 96–112)
Creatinine, Ser: 0.96 mg/dL (ref 0.50–1.35)
GFR calc non Af Amer: 82 mL/min — ABNORMAL LOW (ref >90)
Potassium: 3.7 mEq/L (ref 3.5–5.1)
Total Bilirubin: 0.8 mg/dL (ref 0.3–1.2)
Total Protein: 7.4 g/dL (ref 6.0–8.3)

## 2013-06-21 LAB — POCT I-STAT, CHEM 8
Chloride: 108 mEq/L (ref 96–112)
HCT: 37 % — ABNORMAL LOW (ref 39.0–52.0)
Potassium: 4.3 mEq/L (ref 3.5–5.1)
Sodium: 139 mEq/L (ref 135–145)

## 2013-06-21 LAB — GLUCOSE, CAPILLARY: Glucose-Capillary: 119 mg/dL — ABNORMAL HIGH (ref 70–99)

## 2013-06-21 LAB — PROTIME-INR
INR: 1.38 (ref 0.00–1.49)
Prothrombin Time: 16.6 seconds — ABNORMAL HIGH (ref 11.6–15.2)

## 2013-06-21 LAB — PLATELET COUNT: Platelets: 123 10*3/uL — ABNORMAL LOW (ref 150–400)

## 2013-06-21 LAB — HEMOGLOBIN AND HEMATOCRIT, BLOOD: HCT: 31.6 % — ABNORMAL LOW (ref 39.0–52.0)

## 2013-06-21 LAB — HEPARIN LEVEL (UNFRACTIONATED): Heparin Unfractionated: 0.47 IU/mL (ref 0.30–0.70)

## 2013-06-21 LAB — APTT: aPTT: 30 seconds (ref 24–37)

## 2013-06-21 SURGERY — CORONARY ARTERY BYPASS GRAFTING (CABG)
Anesthesia: General | Site: Chest | Wound class: Clean

## 2013-06-21 MED ORDER — POTASSIUM CHLORIDE 10 MEQ/50ML IV SOLN
10.0000 meq | INTRAVENOUS | Status: AC
  Administered 2013-06-21 (×3): 10 meq via INTRAVENOUS

## 2013-06-21 MED ORDER — METOPROLOL TARTRATE 12.5 MG HALF TABLET
12.5000 mg | ORAL_TABLET | Freq: Two times a day (BID) | ORAL | Status: DC
  Filled 2013-06-21 (×3): qty 1

## 2013-06-21 MED ORDER — PHENYLEPHRINE HCL 10 MG/ML IJ SOLN
10.0000 mg | INTRAVENOUS | Status: DC | PRN
  Administered 2013-06-21: 50 ug/min via INTRAVENOUS

## 2013-06-21 MED ORDER — NITROGLYCERIN IN D5W 200-5 MCG/ML-% IV SOLN
0.0000 ug/min | INTRAVENOUS | Status: DC
  Administered 2013-06-21: 5 ug/min via INTRAVENOUS

## 2013-06-21 MED ORDER — HEMOSTATIC AGENTS (NO CHARGE) OPTIME
TOPICAL | Status: DC | PRN
  Administered 2013-06-21: 1 via TOPICAL

## 2013-06-21 MED ORDER — ACETAMINOPHEN 650 MG RE SUPP
650.0000 mg | Freq: Once | RECTAL | Status: AC
  Administered 2013-06-21: 650 mg via RECTAL

## 2013-06-21 MED ORDER — LACTATED RINGERS IV SOLN
INTRAVENOUS | Status: DC
  Administered 2013-06-21: 14:00:00 via INTRAVENOUS

## 2013-06-21 MED ORDER — ASPIRIN 81 MG PO CHEW: 324.0000 mg | CHEWABLE_TABLET | Freq: Every day | ORAL | Status: DC

## 2013-06-21 MED ORDER — OXYCODONE HCL 5 MG PO TABS
5.0000 mg | ORAL_TABLET | ORAL | Status: DC | PRN
  Administered 2013-06-22 (×2): 5 mg via ORAL
  Filled 2013-06-21 (×2): qty 1

## 2013-06-21 MED ORDER — PROPOFOL 10 MG/ML IV BOLUS
INTRAVENOUS | Status: DC | PRN
  Administered 2013-06-21: 100 mg via INTRAVENOUS

## 2013-06-21 MED ORDER — LACTATED RINGERS IV SOLN
INTRAVENOUS | Status: DC | PRN
  Administered 2013-06-21: 07:00:00 via INTRAVENOUS

## 2013-06-21 MED ORDER — SODIUM CHLORIDE 0.45 % IV SOLN
INTRAVENOUS | Status: DC
  Administered 2013-06-21: 15:00:00 via INTRAVENOUS

## 2013-06-21 MED ORDER — VANCOMYCIN HCL IN DEXTROSE 1-5 GM/200ML-% IV SOLN
1000.0000 mg | Freq: Once | INTRAVENOUS | Status: AC
  Administered 2013-06-21: 1000 mg via INTRAVENOUS
  Filled 2013-06-21: qty 200

## 2013-06-21 MED ORDER — HYPROMELLOSE 0.3 % OP SOLN: 1.0000 [drp] | Freq: Two times a day (BID) | OPHTHALMIC | Status: DC

## 2013-06-21 MED ORDER — MIDAZOLAM HCL 5 MG/5ML IJ SOLN
INTRAMUSCULAR | Status: DC | PRN
  Administered 2013-06-21: 2 mg via INTRAVENOUS
  Administered 2013-06-21 (×2): 3 mg via INTRAVENOUS
  Administered 2013-06-21: 2 mg via INTRAVENOUS

## 2013-06-21 MED ORDER — DEXMEDETOMIDINE HCL IN NACL 400 MCG/100ML IV SOLN
0.4000 ug/kg/h | INTRAVENOUS | Status: DC
  Administered 2013-06-21: 0.7 ug/kg/h via INTRAVENOUS
  Filled 2013-06-21: qty 100

## 2013-06-21 MED ORDER — LEVOFLOXACIN IN D5W 750 MG/150ML IV SOLN
750.0000 mg | Freq: Once | INTRAVENOUS | Status: AC
  Administered 2013-06-22: 750 mg via INTRAVENOUS
  Filled 2013-06-21: qty 150

## 2013-06-21 MED ORDER — METOPROLOL TARTRATE 1 MG/ML IV SOLN: 2.5000 mg | INTRAVENOUS | Status: DC | PRN

## 2013-06-21 MED ORDER — HEPARIN SODIUM (PORCINE) 1000 UNIT/ML IJ SOLN
INTRAMUSCULAR | Status: DC | PRN
  Administered 2013-06-21: 41000 [IU] via INTRAVENOUS

## 2013-06-21 MED ORDER — METOPROLOL TARTRATE 25 MG/10 ML ORAL SUSPENSION
12.5000 mg | Freq: Two times a day (BID) | ORAL | Status: DC
  Filled 2013-06-21 (×3): qty 5

## 2013-06-21 MED ORDER — SODIUM CHLORIDE 0.9 % IJ SOLN: 3.0000 mL | INTRAMUSCULAR | Status: DC | PRN

## 2013-06-21 MED ORDER — SODIUM CHLORIDE 0.9 % IJ SOLN: 3.0000 mL | Freq: Two times a day (BID) | INTRAMUSCULAR | Status: DC

## 2013-06-21 MED ORDER — MIDAZOLAM HCL 2 MG/2ML IJ SOLN: 2.0000 mg | INTRAMUSCULAR | Status: DC | PRN

## 2013-06-21 MED ORDER — ACETAMINOPHEN 160 MG/5ML PO SOLN: 650.0000 mg | Freq: Once | ORAL | Status: AC

## 2013-06-21 MED ORDER — THROMBIN 20000 UNITS EX SOLR
CUTANEOUS | Status: AC
  Filled 2013-06-21: qty 20000

## 2013-06-21 MED ORDER — ONDANSETRON HCL 4 MG/2ML IJ SOLN: 4.0000 mg | Freq: Four times a day (QID) | INTRAMUSCULAR | Status: DC | PRN

## 2013-06-21 MED ORDER — ALBUMIN HUMAN 5 % IV SOLN
INTRAVENOUS | Status: DC | PRN
  Administered 2013-06-21 (×2): via INTRAVENOUS

## 2013-06-21 MED ORDER — ACETAMINOPHEN 160 MG/5ML PO SOLN: 1000.0000 mg | Freq: Four times a day (QID) | ORAL | Status: DC

## 2013-06-21 MED ORDER — BISACODYL 5 MG PO TBEC: 10.0000 mg | DELAYED_RELEASE_TABLET | Freq: Every day | ORAL | Status: DC

## 2013-06-21 MED ORDER — MORPHINE SULFATE 2 MG/ML IJ SOLN
1.0000 mg | INTRAMUSCULAR | Status: DC | PRN
  Administered 2013-06-21: 2 mg via INTRAVENOUS
  Administered 2013-06-21: 1 mg via INTRAVENOUS
  Filled 2013-06-21 (×3): qty 1

## 2013-06-21 MED ORDER — LIDOCAINE HCL (CARDIAC) 20 MG/ML IV SOLN
INTRAVENOUS | Status: DC | PRN
  Administered 2013-06-21: 60 mg via INTRAVENOUS

## 2013-06-21 MED ORDER — PANTOPRAZOLE SODIUM 40 MG PO TBEC: 40.0000 mg | DELAYED_RELEASE_TABLET | Freq: Every day | ORAL | Status: DC

## 2013-06-21 MED ORDER — ACETAMINOPHEN 500 MG PO TABS
1000.0000 mg | ORAL_TABLET | Freq: Four times a day (QID) | ORAL | Status: DC
  Administered 2013-06-22 – 2013-06-26 (×16): 1000 mg via ORAL
  Filled 2013-06-21 (×21): qty 2

## 2013-06-21 MED ORDER — LACTATED RINGERS IV SOLN
INTRAVENOUS | Status: DC | PRN
  Administered 2013-06-21 (×2): via INTRAVENOUS

## 2013-06-21 MED ORDER — GELATIN ABSORBABLE MT POWD
OROMUCOSAL | Status: DC | PRN
  Administered 2013-06-21: 09:00:00 via TOPICAL

## 2013-06-21 MED ORDER — PHENYLEPHRINE HCL 10 MG/ML IJ SOLN
0.0000 ug/min | INTRAVENOUS | Status: DC
  Administered 2013-06-21: 0 ug/min via INTRAVENOUS
  Filled 2013-06-21: qty 2

## 2013-06-21 MED ORDER — PROTAMINE SULFATE 10 MG/ML IV SOLN
INTRAVENOUS | Status: DC | PRN
  Administered 2013-06-21 (×2): 50 mg via INTRAVENOUS
  Administered 2013-06-21: 40 mg via INTRAVENOUS
  Administered 2013-06-21: 50 mg via INTRAVENOUS
  Administered 2013-06-21: 10 mg via INTRAVENOUS
  Administered 2013-06-21 (×3): 50 mg via INTRAVENOUS

## 2013-06-21 MED ORDER — ASPIRIN EC 325 MG PO TBEC
325.0000 mg | DELAYED_RELEASE_TABLET | Freq: Every day | ORAL | Status: DC
  Administered 2013-06-22 – 2013-06-26 (×5): 325 mg via ORAL
  Filled 2013-06-21 (×5): qty 1

## 2013-06-21 MED ORDER — ARTIFICIAL TEARS OP OINT
TOPICAL_OINTMENT | OPHTHALMIC | Status: DC | PRN
  Administered 2013-06-21: 1 via OPHTHALMIC

## 2013-06-21 MED ORDER — DEXMEDETOMIDINE HCL IN NACL 200 MCG/50ML IV SOLN
0.1000 ug/kg/h | INTRAVENOUS | Status: DC
  Administered 2013-06-21: 0.7 ug/kg/h via INTRAVENOUS
  Filled 2013-06-21: qty 50

## 2013-06-21 MED ORDER — SODIUM CHLORIDE 0.9 % IV SOLN: 250.0000 mL | INTRAVENOUS | Status: DC

## 2013-06-21 MED ORDER — SODIUM CHLORIDE 0.9 % IV SOLN
INTRAVENOUS | Status: DC
  Administered 2013-06-21: 15:00:00 via INTRAVENOUS

## 2013-06-21 MED ORDER — ROCURONIUM BROMIDE 100 MG/10ML IV SOLN
INTRAVENOUS | Status: DC | PRN
  Administered 2013-06-21 (×3): 50 mg via INTRAVENOUS
  Administered 2013-06-21: 30 mg via INTRAVENOUS
  Administered 2013-06-21 (×2): 50 mg via INTRAVENOUS

## 2013-06-21 MED ORDER — INSULIN REGULAR BOLUS VIA INFUSION
0.0000 [IU] | Freq: Three times a day (TID) | INTRAVENOUS | Status: DC
  Filled 2013-06-21: qty 10

## 2013-06-21 MED ORDER — FENTANYL CITRATE 0.05 MG/ML IJ SOLN
INTRAMUSCULAR | Status: DC | PRN
  Administered 2013-06-21: 500 ug via INTRAVENOUS
  Administered 2013-06-21 (×2): 150 ug via INTRAVENOUS
  Administered 2013-06-21: 100 ug via INTRAVENOUS
  Administered 2013-06-21 (×2): 50 ug via INTRAVENOUS
  Administered 2013-06-21: 150 ug via INTRAVENOUS
  Administered 2013-06-21: 100 ug via INTRAVENOUS

## 2013-06-21 MED ORDER — SODIUM CHLORIDE 0.9 % IV SOLN
INTRAVENOUS | Status: DC
  Administered 2013-06-21: 2.1 [IU]/h via INTRAVENOUS
  Administered 2013-06-21: 2.4 [IU]/h via INTRAVENOUS
  Filled 2013-06-21 (×2): qty 1

## 2013-06-21 MED ORDER — MAGNESIUM SULFATE 40 MG/ML IJ SOLN
4.0000 g | Freq: Once | INTRAMUSCULAR | Status: AC
  Administered 2013-06-21: 4 g via INTRAVENOUS
  Filled 2013-06-21: qty 100

## 2013-06-21 MED ORDER — ALBUMIN HUMAN 5 % IV SOLN: 250.0000 mL | INTRAVENOUS | Status: DC | PRN

## 2013-06-21 MED ORDER — THROMBIN 20000 UNITS EX KIT
PACK | CUTANEOUS | Status: DC | PRN
  Administered 2013-06-21: 20000 [IU] via TOPICAL

## 2013-06-21 MED ORDER — BISACODYL 10 MG RE SUPP: 10.0000 mg | Freq: Every day | RECTAL | Status: DC

## 2013-06-21 MED ORDER — LACTATED RINGERS IV SOLN: 500.0000 mL | Freq: Once | INTRAVENOUS | Status: AC | PRN

## 2013-06-21 MED ORDER — GLYCOPYRROLATE 0.2 MG/ML IJ SOLN
INTRAMUSCULAR | Status: DC | PRN
  Administered 2013-06-21: 0.4 mg via INTRAVENOUS

## 2013-06-21 MED ORDER — FAMOTIDINE IN NACL 20-0.9 MG/50ML-% IV SOLN
20.0000 mg | Freq: Two times a day (BID) | INTRAVENOUS | Status: DC
  Administered 2013-06-21: 20 mg via INTRAVENOUS

## 2013-06-21 MED ORDER — 0.9 % SODIUM CHLORIDE (POUR BTL) OPTIME
TOPICAL | Status: DC | PRN
  Administered 2013-06-21: 1000 mL

## 2013-06-21 MED ORDER — MORPHINE SULFATE 2 MG/ML IJ SOLN
2.0000 mg | INTRAMUSCULAR | Status: DC | PRN
  Administered 2013-06-21: 4 mg via INTRAVENOUS
  Administered 2013-06-21 (×2): 2 mg via INTRAVENOUS
  Filled 2013-06-21: qty 2
  Filled 2013-06-21: qty 1

## 2013-06-21 MED ORDER — DOCUSATE SODIUM 100 MG PO CAPS: 200.0000 mg | ORAL_CAPSULE | Freq: Every day | ORAL | Status: DC

## 2013-06-21 SURGICAL SUPPLY — 104 items
ATTRACTOMAT 16X20 MAGNETIC DRP (DRAPES) ×3 IMPLANT
BAG DECANTER FOR FLEXI CONT (MISCELLANEOUS) ×3 IMPLANT
BANDAGE ELASTIC 4 VELCRO ST LF (GAUZE/BANDAGES/DRESSINGS) ×3 IMPLANT
BANDAGE ELASTIC 6 VELCRO ST LF (GAUZE/BANDAGES/DRESSINGS) ×3 IMPLANT
BANDAGE GAUZE ELAST BULKY 4 IN (GAUZE/BANDAGES/DRESSINGS) ×3 IMPLANT
BASKET HEART (ORDER IN 25'S) (MISCELLANEOUS) ×1
BASKET HEART (ORDER IN 25S) (MISCELLANEOUS) ×2 IMPLANT
BENZOIN TINCTURE PRP APPL 2/3 (GAUZE/BANDAGES/DRESSINGS) ×3 IMPLANT
BLADE STERNUM SYSTEM 6 (BLADE) ×3 IMPLANT
BLADE SURG 11 STRL SS (BLADE) ×3 IMPLANT
CANISTER SUCTION 2500CC (MISCELLANEOUS) ×3 IMPLANT
CANNULA ARTERIAL NVNT 3/8 22FR (MISCELLANEOUS) ×3 IMPLANT
CANNULA VENOUS LOW PROF 34X46 (CANNULA) IMPLANT
CATH ROBINSON RED A/P 18FR (CATHETERS) ×6 IMPLANT
CATH THORACIC 28FR (CATHETERS) ×3 IMPLANT
CATH THORACIC 28FR RT ANG (CATHETERS) IMPLANT
CATH THORACIC 36FR (CATHETERS) ×3 IMPLANT
CATH THORACIC 36FR RT ANG (CATHETERS) ×3 IMPLANT
CLIP TI MEDIUM 24 (CLIP) IMPLANT
CLIP TI WIDE RED SMALL 24 (CLIP) ×3 IMPLANT
CLSR STERI-STRIP ANTIMIC 1/2X4 (GAUZE/BANDAGES/DRESSINGS) ×3 IMPLANT
COVER SURGICAL LIGHT HANDLE (MISCELLANEOUS) ×3 IMPLANT
CRADLE DONUT ADULT HEAD (MISCELLANEOUS) ×3 IMPLANT
DRAPE CARDIOVASCULAR INCISE (DRAPES) ×1
DRAPE SLUSH/WARMER DISC (DRAPES) ×3 IMPLANT
DRAPE SRG 135X102X78XABS (DRAPES) ×2 IMPLANT
DRSG COVADERM 4X14 (GAUZE/BANDAGES/DRESSINGS) ×3 IMPLANT
ELECT CAUTERY BLADE 6.4 (BLADE) ×3 IMPLANT
ELECT REM PT RETURN 9FT ADLT (ELECTROSURGICAL) ×6
ELECTRODE REM PT RTRN 9FT ADLT (ELECTROSURGICAL) ×4 IMPLANT
GLOVE BIO SURGEON STRL SZ 6 (GLOVE) ×6 IMPLANT
GLOVE BIO SURGEON STRL SZ 6.5 (GLOVE) ×6 IMPLANT
GLOVE BIO SURGEON STRL SZ7 (GLOVE) IMPLANT
GLOVE BIO SURGEON STRL SZ7.5 (GLOVE) IMPLANT
GLOVE BIOGEL PI IND STRL 6 (GLOVE) ×4 IMPLANT
GLOVE BIOGEL PI IND STRL 6.5 (GLOVE) ×12 IMPLANT
GLOVE BIOGEL PI IND STRL 7.0 (GLOVE) ×4 IMPLANT
GLOVE BIOGEL PI INDICATOR 6 (GLOVE) ×2
GLOVE BIOGEL PI INDICATOR 6.5 (GLOVE) ×6
GLOVE BIOGEL PI INDICATOR 7.0 (GLOVE) ×2
GLOVE EUDERMIC 7 POWDERFREE (GLOVE) ×6 IMPLANT
GLOVE ORTHO TXT STRL SZ7.5 (GLOVE) IMPLANT
GOWN PREVENTION PLUS XLARGE (GOWN DISPOSABLE) ×3 IMPLANT
GOWN STRL NON-REIN LRG LVL3 (GOWN DISPOSABLE) ×24 IMPLANT
HEMOSTAT POWDER SURGIFOAM 1G (HEMOSTASIS) ×9 IMPLANT
HEMOSTAT SURGICEL 2X14 (HEMOSTASIS) ×3 IMPLANT
INSERT FOGARTY 61MM (MISCELLANEOUS) IMPLANT
INSERT FOGARTY XLG (MISCELLANEOUS) ×3 IMPLANT
KIT BASIN OR (CUSTOM PROCEDURE TRAY) ×3 IMPLANT
KIT CATH CPB BARTLE (MISCELLANEOUS) ×3 IMPLANT
KIT ROOM TURNOVER OR (KITS) ×3 IMPLANT
KIT SUCTION CATH 14FR (SUCTIONS) ×3 IMPLANT
KIT VASOVIEW W/TROCAR VH 2000 (KITS) ×3 IMPLANT
NS IRRIG 1000ML POUR BTL (IV SOLUTION) ×18 IMPLANT
PACK OPEN HEART (CUSTOM PROCEDURE TRAY) ×3 IMPLANT
PAD ARMBOARD 7.5X6 YLW CONV (MISCELLANEOUS) ×6 IMPLANT
PAD ELECT DEFIB RADIOL ZOLL (MISCELLANEOUS) ×3 IMPLANT
PENCIL BUTTON HOLSTER BLD 10FT (ELECTRODE) ×3 IMPLANT
PUNCH AORTIC ROTATE 4.0MM (MISCELLANEOUS) IMPLANT
PUNCH AORTIC ROTATE 4.5MM 8IN (MISCELLANEOUS) ×3 IMPLANT
PUNCH AORTIC ROTATE 5MM 8IN (MISCELLANEOUS) IMPLANT
SET CARDIOPLEGIA MPS 5001102 (MISCELLANEOUS) ×3 IMPLANT
SPONGE GAUZE 4X4 12PLY (GAUZE/BANDAGES/DRESSINGS) ×6 IMPLANT
SPONGE INTESTINAL PEANUT (DISPOSABLE) IMPLANT
SPONGE LAP 18X18 X RAY DECT (DISPOSABLE) ×3 IMPLANT
SPONGE LAP 4X18 X RAY DECT (DISPOSABLE) ×3 IMPLANT
SUT BONE WAX W31G (SUTURE) ×3 IMPLANT
SUT MNCRL AB 4-0 PS2 18 (SUTURE) ×3 IMPLANT
SUT PROLENE 3 0 SH DA (SUTURE) IMPLANT
SUT PROLENE 3 0 SH1 36 (SUTURE) ×3 IMPLANT
SUT PROLENE 4 0 RB 1 (SUTURE)
SUT PROLENE 4 0 SH DA (SUTURE) IMPLANT
SUT PROLENE 4-0 RB1 .5 CRCL 36 (SUTURE) IMPLANT
SUT PROLENE 5 0 C 1 36 (SUTURE) IMPLANT
SUT PROLENE 6 0 C 1 30 (SUTURE) ×9 IMPLANT
SUT PROLENE 7 0 BV 1 (SUTURE) IMPLANT
SUT PROLENE 7 0 BV1 MDA (SUTURE) ×6 IMPLANT
SUT PROLENE 8 0 BV175 6 (SUTURE) IMPLANT
SUT SILK  1 MH (SUTURE)
SUT SILK 1 MH (SUTURE) IMPLANT
SUT SILK 2 0 SH CR/8 (SUTURE) ×3 IMPLANT
SUT STEEL STERNAL CCS#1 18IN (SUTURE) IMPLANT
SUT STEEL SZ 6 DBL 3X14 BALL (SUTURE) IMPLANT
SUT VIC AB 1 CTX 36 (SUTURE) ×2
SUT VIC AB 1 CTX36XBRD ANBCTR (SUTURE) ×4 IMPLANT
SUT VIC AB 2-0 CT1 27 (SUTURE) ×1
SUT VIC AB 2-0 CT1 TAPERPNT 27 (SUTURE) ×2 IMPLANT
SUT VIC AB 2-0 CTX 27 (SUTURE) IMPLANT
SUT VIC AB 3-0 SH 27 (SUTURE)
SUT VIC AB 3-0 SH 27X BRD (SUTURE) IMPLANT
SUT VIC AB 3-0 X1 27 (SUTURE) IMPLANT
SUT VICRYL 4-0 PS2 18IN ABS (SUTURE) IMPLANT
SUTURE E-PAK OPEN HEART (SUTURE) ×3 IMPLANT
SYSTEM SAHARA CHEST DRAIN ATS (WOUND CARE) ×3 IMPLANT
TAPE CLOTH SURG 4X10 WHT LF (GAUZE/BANDAGES/DRESSINGS) ×3 IMPLANT
TAPE PAPER 2X10 WHT MICROPORE (GAUZE/BANDAGES/DRESSINGS) ×3 IMPLANT
TOWEL OR 17X24 6PK STRL BLUE (TOWEL DISPOSABLE) ×3 IMPLANT
TOWEL OR 17X26 10 PK STRL BLUE (TOWEL DISPOSABLE) ×3 IMPLANT
TRAY CATH LUMEN 1 20CM STRL (SET/KITS/TRAYS/PACK) ×3 IMPLANT
TRAY FOLEY IC TEMP SENS 14FR (CATHETERS) ×3 IMPLANT
TUBING ART PRESS 48 MALE/FEM (TUBING) ×6 IMPLANT
TUBING INSUFFLATION 10FT LAP (TUBING) ×3 IMPLANT
UNDERPAD 30X30 INCONTINENT (UNDERPADS AND DIAPERS) ×3 IMPLANT
WATER STERILE IRR 1000ML POUR (IV SOLUTION) ×6 IMPLANT

## 2013-06-21 NOTE — Anesthesia Preprocedure Evaluation (Addendum)
Anesthesia Evaluation  Patient identified by MRN, date of birth, ID band Patient awake    Reviewed: Allergy & Precautions, H&P , NPO status , Patient's Chart, lab work & pertinent test results, reviewed documented beta blocker date and time   Airway Mallampati: II TM Distance: >3 FB Neck ROM: Full    Dental  (+) Dental Advisory Given and Teeth Intact   Pulmonary former smoker,          Cardiovascular hypertension, Pt. on medications and Pt. on home beta blockers + angina + CAD, + Cardiac Stents and + Peripheral Vascular Disease  06/16/13 Cath Impression  Normal left main coronary artery. 80-90% mid left anterior descending artery lesion. 99% lesion in the mid left circumflex artery. 70% ostial OM1 stenosis. Ostial 50% stenosis right coronary artery. Sequential 99% stenosis in the mid RCA. Subtotal occluion of the distal RCA with left to right collaterals.  -S/p AAA repair 1996. Normal left ventricular systolic function. LVEDP 11 mmHg. Ejection fraction 55%.   Neuro/Psych CVA, No Residual Symptoms    GI/Hepatic   Endo/Other  Morbid obesity  Renal/GU Renal disease     Musculoskeletal  (+) Arthritis -,   Abdominal   Peds  Hematology   Anesthesia Other Findings   Reproductive/Obstetrics                         Anesthesia Physical Anesthesia Plan  ASA: III  Anesthesia Plan: General   Post-op Pain Management:    Induction: Intravenous  Airway Management Planned: Oral ETT  Additional Equipment: Arterial line, CVP, TEE and PA Cath  Intra-op Plan:   Post-operative Plan: Post-operative intubation/ventilation  Informed Consent: I have reviewed the patients History and Physical, chart, labs and discussed the procedure including the risks, benefits and alternatives for the proposed anesthesia with the patient or authorized representative who has indicated his/her understanding and acceptance.    Dental advisory given  Plan Discussed with: Anesthesiologist, Surgeon and CRNA  Anesthesia Plan Comments:        Anesthesia Quick Evaluation

## 2013-06-21 NOTE — Preoperative (Signed)
Beta Blockers   Reason not to administer Beta Blockers:Not Applicable, patient took Propanolol 06/20/13 at 1649.

## 2013-06-21 NOTE — OR Nursing (Signed)
Sacral dressing applied prior to procedure start per protocol.

## 2013-06-21 NOTE — Procedures (Signed)
Extubation Procedure Note  Patient Details:   Name: Dale Lynch DOB: [birth year] MRN: [id number]   Airway Documentation:     Evaluation  O2 sats: stable throughout Complications: No apparent complications Patient did tolerate procedure well. Bilateral Breath Sounds: Clear;Diminished   Yes NIF-30 [id number]-971ml Vocalizes well post extubation Positive cuff leak  Newt Lukes 06/21/2013, 6:30 PM

## 2013-06-21 NOTE — Op Note (Signed)
CARDIOVASCULAR SURGERY OPERATIVE NOTE  06/21/2013  Surgeon:  Alleen Borne, MD  First Assistant: Coral Ceo, [facility]   Preoperative Diagnosis:  Severe multi-vessel coronary artery disease   Postoperative Diagnosis:  Same   Procedure:  1. Median Sternotomy 2. Extracorporeal circulation 3.   Coronary artery bypass grafting x 5   Left internal mammary graft to the LAD  SVG to D2  Seq SVG to D1 and OM1  SVG to PDA    4.   Endoscopic vein harvest from the right leg 5.   Insertion of left femoral arterial line   Anesthesia:  General Endotracheal   Clinical History/Surgical Indication:  The patient is a 71 year old gentleman who reports a several month history of atypical muscular type pain across his anterior chest that he thought may be due to statin. He stopped this and that pain resolved but then he noticed a different substernal chest tightness with exertion, resolving with rest. 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 Cath was done and shows severe multi-vessel disease. agree with need for CABG. He has been on Plavix chronically for his prior stroke. This has been stopped. I discussed the operative procedure with the patient and wife including alternatives, benefits and risks; including but not limited to bleeding, blood transfusion, infection, stroke, myocardial infarction, graft failure, heart block requiring a permanent pacemaker, organ dysfunction, and death. Dale Lynch understands and agrees to proceed.     Preparation:  The patient was seen in the preoperative holding area and the correct patient, correct operation were confirmed with the patient after reviewing the medical record and catheterization. The consent was signed by me. Preoperative antibiotics were given. A pulmonary  arterial line and radial arterial line were placed by the anesthesia team. The patient was taken back to the operating room and positioned supine on the operating room table. After being placed under general endotracheal anesthesia by the anesthesia team a foley catheter was placed. The neck, chest, abdomen, and both legs were prepped with betadine soap and solution and draped in the usual sterile manner. A surgical time-out was taken and the correct patient and operative procedure were confirmed with the nursing and anesthesia staff.  TEE:  Performed by Dr. Diamantina Monks. Pre- bypass it showed global hypokinesis with no MR. Post-bypass the LV function was much improved and there was no MR.  Cardiopulmonary Bypass:  A median sternotomy was performed. The pericardium was opened in the midline. Right ventricular function appeared normal. The ascending aorta was of normal size and had no palpable plaque. There were no contraindications to aortic cannulation or cross-clamping. The patient was fully systemically heparinized and the ACT was maintained > 400 sec. The proximal aortic arch was cannulated with a 22 F aortic cannula for arterial inflow. Venous cannulation was performed via the right atrial appendage using a two-staged venous cannula. An antegrade cardioplegia/vent cannula was inserted into the mid-ascending aorta. Aortic occlusion was performed with a single cross-clamp. Systemic cooling to 32 degrees Centigrade and topical cooling of the heart with iced saline were used. Hyperkalemic antegrade cold blood cardioplegia was used to induce diastolic arrest and was then given at about 20 minute intervals throughout the period of arrest to maintain myocardial temperature at or below 10 degrees centigrade. A temperature probe was inserted into the interventricular septum and an insulating pad was placed in the pericardium.   Left internal mammary harvest:  The left side of the sternum was retracted using  the Rultract retractor. The left internal mammary artery was harvested as a pedicle graft. All side branches were clipped. It was a medium-sized vessel of good quality with excellent blood flow. It was ligated distally and divided. It was sprayed with topical papaverine solution to prevent vasospasm.   Endoscopic vein harvest:  The right greater saphenous vein was harvested endoscopically through a 2 cm incision medial to the right knee. It was harvested from the upper thigh to below the knee. It was a medium-sized vein of good quality. The side branches were all ligated with 4-0 silk ties.    Coronary arteries:  The coronary arteries were examined.   LAD:  Large vessel with minimal distal disease. D1 is a moderate-sized vessel with no distal disease. D2 is a small-moderate sized vessel with no distal disease  LCX:  OM1 is a large vessel with mild distal disease. OM2 is very small and not graftable.  RCA:  Diffusely and severely diseased extending out to the origin of the PDA. The PDA is a moderate-sized vessel with no distal disease.   Grafts:  1. LIMA to the LAD: 2.0 mm. It was sewn end to side using 8-0 prolene continuous suture. 2. Seq SVG to D1:  1.6 mm. It was sewn side to side using 7-0 prolene continuous suture. 3. Seq SVG to OM1:  1.75 mm. It was sewn end to side using 7-0 prolene continuous suture. 4. SVG to D2:  1.5 mm. It was sewn end to side using 7-0 prolene continuous suture. 5. SVG to PDA:  1.6 mm. It was sewn end to side using 7-0 prolene continuous suture.  The proximal vein graft anastomoses were performed to the mid-ascending aorta using continuous 6-0 prolene suture. Graft markers were placed around the proximal anastomoses.   Completion:  The patient was rewarmed to 37 degrees Centigrade. The clamp was removed from the LIMA pedicle and there was rapid warming of the septum and return of ventricular fibrillation. The crossclamp was removed with a time of 96  minutes. There was spontaneous return of sinus rhythm. The distal and proximal anastomoses were checked for hemostasis. The position of the grafts was satisfactory. Two temporary epicardial pacing wires were placed on the right atrium and two on the right ventricle. The patient was weaned from CPB without difficulty on no inotropes. CPB time was 113 minutes. Cardiac output was 6.0 LPM. Heparin was fully reversed with protamine and the aortic and venous cannulas removed. Hemostasis was achieved. Mediastinal and left pleural drainage tubes were placed. The sternum was closed with double #6 stainless steel wires. The fascia was closed with continuous # 1 vicryl suture. The subcutaneous tissue was closed with 2-0 vicryl continuous suture. The skin was closed with 3-0 vicryl subcuticular suture. All sponge, needle, and instrument counts were reported correct at the end of the case. Dry sterile dressings were placed over the incisions and around the chest tubes which were connected to pleurevac suction. The patient was then transported to the surgical intensive care unit in critical but stable condition.

## 2013-06-21 NOTE — Anesthesia Procedure Notes (Signed)
Procedure Name: Intubation Date/Time: 06/21/2013 7:49 AM Performed by: Everette Rank Pre-anesthesia Checklist: Patient identified, Emergency Drugs available, Suction available and Patient being monitored Patient Re-evaluated:Patient Re-evaluated prior to inductionOxygen Delivery Method: Circle system utilized Preoxygenation: Pre-oxygenation with 100% oxygen Intubation Type: IV induction Ventilation: Mask ventilation without difficulty Laryngoscope Size: Miller and 2 Grade View: Grade I Tube type: Oral Tube size: 8.5 mm Number of attempts: 1 Airway Equipment and Method: Stylet Placement Confirmation: ETT inserted through vocal cords under direct vision,  positive ETCO2 and breath sounds checked- equal and bilateral Secured at: 24 cm Tube secured with: Tape Dental Injury: Teeth and Oropharynx as per pre-operative assessment

## 2013-06-21 NOTE — Progress Notes (Signed)
Echocardiogram Echocardiogram Transesophageal has been performed.  Dale Lynch 06/21/2013, 1:09 PM

## 2013-06-21 NOTE — Transfer of Care (Signed)
Immediate Anesthesia Transfer of Care Note  Patient: Dale Lynch  Procedure(s) Performed: Procedure(s): CORONARY ARTERY BYPASS GRAFTING (CABG) (N/A) INTRAOPERATIVE TRANSESOPHAGEAL ECHOCARDIOGRAM (N/A)  Patient Location: SICU  Anesthesia Type:General  Level of Consciousness: sedated and Patient remains intubated per anesthesia plan  Airway & Oxygen Therapy: Patient remains intubated per anesthesia plan and Patient placed on Ventilator (see vital sign flow sheet for setting)  Post-op Assessment: Report given to PACU RN and Post -op Vital signs reviewed and stable  Post vital signs: Reviewed and stable  Complications: No apparent anesthesia complications

## 2013-06-21 NOTE — Anesthesia Postprocedure Evaluation (Signed)
  Anesthesia Post-op Note  Patient: Dale Lynch  Procedure(s) Performed: Procedure(s): CORONARY ARTERY BYPASS GRAFTING (CABG) (N/A) INTRAOPERATIVE TRANSESOPHAGEAL ECHOCARDIOGRAM (N/A)  Patient Location: SICU  Anesthesia Type:General  Level of Consciousness: Patient remains intubated per anesthesia plan  Airway and Oxygen Therapy: Patient Spontanous Breathing, Patient remains intubated per anesthesia plan and Patient placed on Ventilator (see vital sign flow sheet for setting)  Post-op Pain: none  Post-op Assessment: Post-op Vital signs reviewed, Patient's Cardiovascular Status Stable, Respiratory Function Stable, Patent Airway, No signs of Nausea or vomiting and Pain level not controlled  Post-op Vital Signs: stable  Complications: No apparent anesthesia complications

## 2013-06-21 NOTE — Plan of Care (Signed)
Problem: Phase II Progression Outcomes Goal: Patient extubated within - Outcome: Completed/Met Date Met:  06/21/13 6-12 hrs.

## 2013-06-21 NOTE — Brief Op Note (Signed)
06/16/2013 - 06/21/2013  11:34 AM  PATIENT:  Dale Lynch  72 y.o. male  PRE-OPERATIVE DIAGNOSIS:  CAD  POST-OPERATIVE DIAGNOSIS:  CAD  PROCEDURE:   CORONARY ARTERY BYPASS GRAFTING x 5 (LIMA-LAD, SVG-D2, SVG-D1-OM1, SVG-PD)  ENDOSCOPIC VEIN HARVEST RIGHT LEG  SURGEON:  Alleen Borne, MD  ASSISTANT: Coral Ceo, PA-C  ANESTHESIA:   general  PATIENT CONDITION:  ICU - intubated and hemodynamically stable.  PRE-OPERATIVE WEIGHT: 113 kg

## 2013-06-21 NOTE — Progress Notes (Signed)
Patient ID: Dale Lynch, male   DOB: [birth year], 72 y.o.   MRN: [id number] EVENING ROUNDS NOTE :     [address]       [place] [address]             [phone]                 Day of Surgery Procedure(s) (LRB): CORONARY ARTERY BYPASS GRAFTING (CABG) (N/A) INTRAOPERATIVE TRANSESOPHAGEAL ECHOCARDIOGRAM (N/A)  Total Length of Stay:  LOS: 5 days  BP 101/67  Pulse 80  Temp(Src) 97.9 F (36.6 C) (Oral)  Resp 16  Ht 5\' 10"  (1.778 m)  Wt 249 lb 9 oz (113.2 kg)  BMI 35.81 kg/m2  SpO2 96%  .Intake/Output     10/19 0701 - 10/20 0700 10/20 0701 - 10/21 0700   P.O. 240    I.V. (mL/kg) 80.7 (0.7) 2518.8 (22.3)   Blood  480   IV Piggyback  800   Total Intake(mL/kg) 320.7 (2.8) 3798.8 (33.6)   Urine (mL/kg/hr)  2075 (1.7)   Chest Tube  210 (0.2)   Total Output   2285   Net +320.7 +1513.8          . sodium chloride 20 mL/hr at 06/21/13 1700  . sodium chloride 20 mL/hr at 06/21/13 1700  . [START ON 06/22/2013] sodium chloride    . dexmedetomidine 0.2 mcg/kg/hr (06/21/13 1700)  . dexmedetomidine 0.7 mcg/kg/hr (06/21/13 1453)  . insulin (NOVOLIN-R) infusion 1.8 mL/hr at 06/21/13 1700  . lactated ringers 20 mL/hr at 06/21/13 1700  . nitroGLYCERIN 5 mcg/min (06/21/13 1700)  . phenylephrine (NEO-SYNEPHRINE) Adult infusion Stopped (06/21/13 1500)     Lab Results  Component Value Date   WBC 12.8* 06/21/2013   HGB 11.9* 06/21/2013   HCT 35.0* 06/21/2013   PLT 129* 06/21/2013   GLUCOSE 129* 06/21/2013   ALT 100* 06/21/2013   AST 73* 06/21/2013   NA 138 06/21/2013   K 3.8 06/21/2013   CL 99 06/21/2013   CREATININE 0.96 06/21/2013   BUN 13 06/21/2013   CO2 27 06/21/2013   INR 1.38 06/21/2013   HGBA1C 5.7* 06/20/2013   Early post op Not bleeding CI >2  Delight Ovens MD  Beeper [phone] Office [phone] 06/21/2013 5:34 PM

## 2013-06-22 ENCOUNTER — Encounter (HOSPITAL_COMMUNITY): Payer: Self-pay | Admitting: Surgery

## 2013-06-22 ENCOUNTER — Inpatient Hospital Stay (HOSPITAL_COMMUNITY): Payer: Medicare Other

## 2013-06-22 LAB — GLUCOSE, CAPILLARY
Glucose-Capillary: 105 mg/dL — ABNORMAL HIGH (ref 70–99)
Glucose-Capillary: 109 mg/dL — ABNORMAL HIGH (ref 70–99)
Glucose-Capillary: 121 mg/dL — ABNORMAL HIGH (ref 70–99)
Glucose-Capillary: 122 mg/dL — ABNORMAL HIGH (ref 70–99)
Glucose-Capillary: 123 mg/dL — ABNORMAL HIGH (ref 70–99)
Glucose-Capillary: 128 mg/dL — ABNORMAL HIGH (ref 70–99)
Glucose-Capillary: 145 mg/dL — ABNORMAL HIGH (ref 70–99)
Glucose-Capillary: 108 mg/dL — ABNORMAL HIGH (ref 70–99)
Glucose-Capillary: 108 mg/dL — ABNORMAL HIGH (ref 70–99)
Glucose-Capillary: 117 mg/dL — ABNORMAL HIGH (ref 70–99)
Glucose-Capillary: 124 mg/dL — ABNORMAL HIGH (ref 70–99)
Glucose-Capillary: 130 mg/dL — ABNORMAL HIGH (ref 70–99)
Glucose-Capillary: 99 mg/dL (ref 70–99)
Glucose-Capillary: 110 mg/dL — ABNORMAL HIGH (ref 70–99)
Glucose-Capillary: 129 mg/dL — ABNORMAL HIGH (ref 70–99)

## 2013-06-22 LAB — BASIC METABOLIC PANEL
CO2: 22 mEq/L (ref 19–32)
Calcium: 8 mg/dL — ABNORMAL LOW (ref 8.4–10.5)
Chloride: 107 mEq/L (ref 96–112)
Creatinine, Ser: 0.85 mg/dL (ref 0.50–1.35)

## 2013-06-22 LAB — CBC
HCT: 36 % — ABNORMAL LOW (ref 39.0–52.0)
Hemoglobin: 12.8 g/dL — ABNORMAL LOW (ref 13.0–17.0)
MCHC: 35.6 g/dL (ref 30.0–36.0)
MCV: 90.5 fL (ref 78.0–100.0)
Platelets: 141 10*3/uL — ABNORMAL LOW (ref 150–400)
RBC: 3.98 MIL/uL — ABNORMAL LOW (ref 4.22–5.81)
WBC: 13.6 10*3/uL — ABNORMAL HIGH (ref 4.0–10.5)

## 2013-06-22 LAB — MAGNESIUM: Magnesium: 2.1 mg/dL (ref 1.5–2.5)

## 2013-06-22 MED ORDER — FUROSEMIDE 40 MG PO TABS
40.0000 mg | ORAL_TABLET | Freq: Every day | ORAL | Status: DC
  Administered 2013-06-22: 40 mg via ORAL
  Filled 2013-06-22 (×3): qty 1

## 2013-06-22 MED ORDER — ONDANSETRON HCL 4 MG PO TABS: 4.0000 mg | ORAL_TABLET | Freq: Four times a day (QID) | ORAL | Status: DC | PRN

## 2013-06-22 MED ORDER — BISACODYL 5 MG PO TBEC
10.0000 mg | DELAYED_RELEASE_TABLET | Freq: Every day | ORAL | Status: DC | PRN
  Administered 2013-06-23: 10 mg via ORAL
  Filled 2013-06-22: qty 2

## 2013-06-22 MED ORDER — ACETAMINOPHEN 325 MG PO TABS: 650.0000 mg | ORAL_TABLET | Freq: Four times a day (QID) | ORAL | Status: DC | PRN

## 2013-06-22 MED ORDER — POTASSIUM CHLORIDE CRYS ER 20 MEQ PO TBCR
20.0000 meq | EXTENDED_RELEASE_TABLET | Freq: Every day | ORAL | Status: DC
  Administered 2013-06-22 – 2013-06-26 (×5): 20 meq via ORAL
  Filled 2013-06-22 (×5): qty 1

## 2013-06-22 MED ORDER — DOCUSATE SODIUM 100 MG PO CAPS
200.0000 mg | ORAL_CAPSULE | Freq: Every day | ORAL | Status: DC
  Administered 2013-06-22 – 2013-06-24 (×3): 200 mg via ORAL
  Filled 2013-06-22 (×5): qty 2

## 2013-06-22 MED ORDER — SODIUM CHLORIDE 0.9 % IJ SOLN
3.0000 mL | INTRAMUSCULAR | Status: DC | PRN
  Administered 2013-06-22: 3 mL via INTRAVENOUS

## 2013-06-22 MED ORDER — SODIUM CHLORIDE 0.9 % IJ SOLN
3.0000 mL | Freq: Two times a day (BID) | INTRAMUSCULAR | Status: DC
  Administered 2013-06-22 – 2013-06-25 (×7): 3 mL via INTRAVENOUS

## 2013-06-22 MED ORDER — ALUM & MAG HYDROXIDE-SIMETH 200-200-20 MG/5ML PO SUSP: 15.0000 mL | ORAL | Status: DC | PRN

## 2013-06-22 MED ORDER — PANTOPRAZOLE SODIUM 40 MG PO TBEC
40.0000 mg | DELAYED_RELEASE_TABLET | Freq: Every day | ORAL | Status: DC
  Administered 2013-06-23 – 2013-06-26 (×3): 40 mg via ORAL
  Filled 2013-06-22 (×4): qty 1

## 2013-06-22 MED ORDER — INSULIN ASPART 100 UNIT/ML ~~LOC~~ SOLN
0.0000 [IU] | Freq: Three times a day (TID) | SUBCUTANEOUS | Status: DC
  Administered 2013-06-22: 2 [IU] via SUBCUTANEOUS

## 2013-06-22 MED ORDER — SODIUM CHLORIDE 0.9 % IV SOLN: 250.0000 mL | INTRAVENOUS | Status: DC | PRN

## 2013-06-22 MED ORDER — BISACODYL 10 MG RE SUPP
10.0000 mg | Freq: Every day | RECTAL | Status: DC | PRN
  Administered 2013-06-25: 10 mg via RECTAL
  Filled 2013-06-22: qty 1

## 2013-06-22 MED ORDER — ONDANSETRON HCL 4 MG/2ML IJ SOLN: 4.0000 mg | Freq: Four times a day (QID) | INTRAMUSCULAR | Status: DC | PRN

## 2013-06-22 MED ORDER — INSULIN DETEMIR 100 UNIT/ML ~~LOC~~ SOLN
15.0000 [IU] | SUBCUTANEOUS | Status: AC
  Administered 2013-06-22: 15 [IU] via SUBCUTANEOUS
  Filled 2013-06-22: qty 0.15

## 2013-06-22 MED ORDER — OXYCODONE HCL 5 MG PO TABS
5.0000 mg | ORAL_TABLET | ORAL | Status: DC | PRN
  Administered 2013-06-22 (×3): 5 mg via ORAL
  Administered 2013-06-22 – 2013-06-23 (×9): 10 mg via ORAL
  Administered 2013-06-24: 5 mg via ORAL
  Administered 2013-06-24 (×5): 10 mg via ORAL
  Administered 2013-06-25 – 2013-06-26 (×7): 5 mg via ORAL
  Filled 2013-06-22 (×3): qty 2
  Filled 2013-06-22: qty 1
  Filled 2013-06-22 (×3): qty 2
  Filled 2013-06-22 (×3): qty 1
  Filled 2013-06-22: qty 2
  Filled 2013-06-22: qty 1
  Filled 2013-06-22 (×5): qty 2
  Filled 2013-06-22: qty 1
  Filled 2013-06-22 (×2): qty 2
  Filled 2013-06-22: qty 1
  Filled 2013-06-22: qty 2
  Filled 2013-06-22 (×2): qty 1
  Filled 2013-06-22: qty 2

## 2013-06-22 MED ORDER — MOVING RIGHT ALONG BOOK
Freq: Once | Status: AC
  Administered 2013-06-22: 13:00:00
  Filled 2013-06-22: qty 1

## 2013-06-22 MED ORDER — TRAMADOL HCL 50 MG PO TABS
50.0000 mg | ORAL_TABLET | ORAL | Status: DC | PRN
  Administered 2013-06-22: 100 mg via ORAL

## 2013-06-22 MED ORDER — MAGNESIUM HYDROXIDE 400 MG/5ML PO SUSP: 30.0000 mL | Freq: Every day | ORAL | Status: DC | PRN

## 2013-06-22 MED FILL — Magnesium Sulfate Inj 50%: INTRAMUSCULAR | Qty: 10 | Status: AC

## 2013-06-22 MED FILL — Potassium Chloride Inj 2 mEq/ML: INTRAVENOUS | Qty: 40 | Status: AC

## 2013-06-22 MED FILL — Sodium Chloride IV Soln 0.9%: INTRAVENOUS | Qty: 1000 | Status: AC

## 2013-06-22 MED FILL — Heparin Sodium (Porcine) Inj 1000 Unit/ML: INTRAMUSCULAR | Qty: 30 | Status: AC

## 2013-06-22 NOTE — Progress Notes (Signed)
Pt transferred to room 2W24. VSS. RN Hope at bedside

## 2013-06-22 NOTE — Progress Notes (Signed)
[facility] DAILY ICU PROGRESS NOTE                   [address]            [place] [address]          [phone]   1 Day Post-Op Procedure(s) (LRB): CORONARY ARTERY BYPASS GRAFTING (CABG) (N/A) INTRAOPERATIVE TRANSESOPHAGEAL ECHOCARDIOGRAM (N/A)  Total Length of Stay:  LOS: 6 days   Subjective: Feels well, a little sore, but no other complaints.   Objective: Vital signs in last 24 hours: Temp:  [95.9 F (35.5 C)-100.9 F (38.3 C)] 100.4 F (38 C) (10/21 0300) Pulse Rate:  [78-88] 79 (10/21 0300) Cardiac Rhythm:  [-] Atrial paced (10/21 0000) Resp:  [12-30] 21 (10/21 0300) BP: (81-104)/(58-67) 103/58 mmHg (10/21 0300) SpO2:  [93 %-99 %] 94 % (10/21 0300) Arterial Line BP: (98-144)/(42-88) 112/58 mmHg (10/21 0300) FiO2 (%):  [40 %-100 %] 40 % (10/20 1802)  Filed Weights   06/16/13 0703 06/17/13 0008  Weight: 249 lb (112.946 kg) 249 lb 9 oz (113.2 kg)  PRE-OPERATIVE WEIGHT: 113 kg   Weight change:    Hemodynamic parameters for last 24 hours: PAP: (23-42)/(11-25) 31/11 mmHg CO:  [4.6 L/min-8.9 L/min] 6.3 L/min CI:  [2.3 L/min/m2-4.4 L/min/m2] 3.1 L/min/m2  Intake/Output from previous day: 10/20 0701 - 10/21 0700 In: 4843.1 [I.V.:3363.1; Blood:480; IV Piggyback:1000] Out: 3290 [Urine:2830; Emesis/NG output:50; Chest Tube:410]  CBGs  104-123-122-145-125-121-109-105-104-105-129    Current Meds: Scheduled Meds: . acetaminophen  1,000 mg Oral Q6H   Or  . acetaminophen (TYLENOL) oral liquid 160 mg/5 mL  1,000 mg Per Tube Q6H  . aspirin EC  325 mg Oral Daily   Or  . aspirin  324 mg Per Tube Daily  . bisacodyl  10 mg Oral Daily   Or  . bisacodyl  10 mg Rectal Daily  . docusate sodium  200 mg Oral Daily  . famotidine (PEPCID) IV  20 mg Intravenous Q12H  . fluticasone  1 spray Each Nare Daily  . Hypromellose  1 drop Ophthalmic BID  . insulin regular  0-10 Units Intravenous TID WC  . levofloxacin (LEVAQUIN) IV  750 mg Intravenous Once  .  metoprolol tartrate  12.5 mg Oral BID   Or  . metoprolol tartrate  12.5 mg Per Tube BID  . [START ON 06/23/2013] pantoprazole  40 mg Oral Daily  . simvastatin  10 mg Oral Q supper  . sodium chloride  3 mL Intravenous Q12H   Continuous Infusions: . sodium chloride 20 mL/hr at 06/21/13 1500  . sodium chloride 20 mL/hr at 06/21/13 2100  . sodium chloride    . dexmedetomidine Stopped (06/21/13 1800)  . dexmedetomidine 0.7 mcg/kg/hr (06/21/13 1453)  . insulin (NOVOLIN-R) infusion 2.6 Units/hr (06/22/13 0728)  . lactated ringers 20 mL/hr at 06/21/13 1800  . nitroGLYCERIN Stopped (06/21/13 2000)  . phenylephrine (NEO-SYNEPHRINE) Adult infusion Stopped (06/21/13 1500)   PRN Meds:.albumin human, metoprolol, midazolam, morphine injection, ondansetron (ZOFRAN) IV, oxyCODONE, sodium chloride   Physical Exam: General appearance: alert, cooperative and no distress Heart: paced at 80, SR 60s with some PVCs under pacer Lungs: diminished breath sounds bilaterally Extremities: Trace LE edema Wound: Dressed and dry  Lab Results: CBC: Recent Labs  06/21/13 1940 06/21/13 1951 06/22/13 0408  WBC 13.7*  --  13.6*  HGB 13.3 12.6* 12.8*  HCT 37.6* 37.0* 36.0*  PLT 145*  --  141*   BMET:  Recent Labs  06/21/13 0545  06/21/13 1951 06/22/13 0408  NA 138  < > 139 140  K 3.7  < > 4.3 4.0  CL 99  --  108 107  CO2 27  --   --  22  GLUCOSE 106*  < > 146* 129*  BUN 13  --  11 13  CREATININE 0.96  < > 0.90 0.85  CALCIUM 9.7  --   --  8.0*  < > = values in this interval not displayed.  PT/INR:  Recent Labs  06/21/13 1340  LABPROT 16.6*  INR 1.38   Radiology: Dg Chest 2 View  06/20/2013   CLINICAL DATA:  Pre-op evaluation for CABG.  EXAM: CHEST  2 VIEW  COMPARISON:  Chest radiographs 04/08/2011.  FINDINGS: There is suboptimal inspiration on the frontal examination with resulting vascular crowding in both lung bases. Mild cardiomegaly appears stable. There is no overt pulmonary edema,  confluent airspace opacity or significant pleural effusion. The osseous structures appear stable.  IMPRESSION: Suboptimal inspiration. No acute cardiopulmonary process.   Electronically Signed   By: Roxy Horseman M.D.   On: 06/20/2013 14:50   Dg Chest Portable 1 View In Am  06/22/2013   CLINICAL DATA:  Postop CABG.  EXAM: PORTABLE CHEST - 1 VIEW  COMPARISON:  06/21/2013  FINDINGS: Prior CABG. Interval extubation and removal of NG tube. Swan-Ganz catheter and left chest tube remain in place. No pneumothorax. Cardiomegaly with left base atelectasis. Right lung is clear.  IMPRESSION: Interval extubation.  No pneumothorax. Left base atelectasis.   Electronically Signed   By: Charlett Nose M.D.   On: 06/22/2013 07:42   Dg Chest Portable 1 View  06/21/2013   CLINICAL DATA:  Inaccurate instrument count  EXAM: PORTABLE CHEST - 1 VIEW  COMPARISON:  06/20/2013  FINDINGS: The cardiac shadow is mildly enlarged but accentuated by the portable technique. A mediastinal drain and left thoracostomy catheter are seen. An endotracheal tube is noted in satisfactory position. A Swan-Ganz catheter is noted in the pulmonary outflow tract. No pneumothorax or focal infiltrate is seen. A drain is also noted in the left upper quadrant which may be pericardial in nature. No radiopaque foreign body is identified correspond with the missing instrument.  IMPRESSION: No evidence of radiopaque foreign body.  Postoperative change with tubes and lines as described.  These results were called by telephone at the time of interpretation on 06/21/2013 at 1:30 PM to Benay Spice, who verbally acknowledged these results.   Electronically Signed   By: Alcide Clever M.D.   On: 06/21/2013 13:30     Assessment/Plan: S/P Procedure(s) (LRB): CORONARY ARTERY BYPASS GRAFTING (CABG) (N/A) INTRAOPERATIVE TRANSESOPHAGEAL ECHOCARDIOGRAM (N/A)  CV- had some bradycardia early on, now HR 60s.  Will d/c pacer and hold off on beta blocker for now.   Volume  overload- Diuresis.  Elevated CBGs- will give 1 time dose of Levemir, then start SSI.  D/c tubes/lines, mobilize .  Plan for transfer to step-down: see transfer orders.     Cleone Hulick H 06/22/2013 7:45 AM

## 2013-06-22 NOTE — Progress Notes (Signed)
CBG 99. Turned off insulin drip. Patient resting comfortably. Will continue to monitor. Stanton Kidney R

## 2013-06-22 NOTE — Progress Notes (Signed)
Bedrest complete at 2:30. Got patient up and completed 1st Post-OP Walk. Patient ambulated approx. 150 feet using front wheel walker, 3L [place]. Patient tolerated walk well. Slightly SOB. Patient currently resting in bed. Patient stable. Will continue to monitor. Stanton Kidney R

## 2013-06-22 NOTE — Progress Notes (Signed)
Received pt from [facility]. Pt is stable. CBG for 1300 glucostabilizer is 96. Changed insulin rate to 1.1. Will shut drip off at 1400 per order since levemir was given at 1200. Will continue to monitor.

## 2013-06-23 ENCOUNTER — Inpatient Hospital Stay (HOSPITAL_COMMUNITY): Payer: Medicare Other

## 2013-06-23 LAB — BASIC METABOLIC PANEL
CO2: 26 mEq/L (ref 19–32)
Calcium: 8.4 mg/dL (ref 8.4–10.5)
Chloride: 98 mEq/L (ref 96–112)
GFR calc Af Amer: >90 mL/min (ref >90)
Potassium: 4 mEq/L (ref 3.5–5.1)

## 2013-06-23 LAB — GLUCOSE, CAPILLARY: Glucose-Capillary: 102 mg/dL — ABNORMAL HIGH (ref 70–99)

## 2013-06-23 LAB — CBC
Hemoglobin: 12.8 g/dL — ABNORMAL LOW (ref 13.0–17.0)
MCH: 32.2 pg (ref 26.0–34.0)
MCV: 93.2 fL (ref 78.0–100.0)
Platelets: 153 10*3/uL (ref 150–400)
RBC: 3.98 MIL/uL — ABNORMAL LOW (ref 4.22–5.81)
RDW: 13.1 % (ref 11.5–15.5)
WBC: 16.9 10*3/uL — ABNORMAL HIGH (ref 4.0–10.5)

## 2013-06-23 MED ORDER — FUROSEMIDE 40 MG PO TABS
40.0000 mg | ORAL_TABLET | Freq: Every day | ORAL | Status: DC
  Administered 2013-06-24 – 2013-06-26 (×3): 40 mg via ORAL
  Filled 2013-06-23 (×3): qty 1

## 2013-06-23 MED ORDER — FUROSEMIDE 10 MG/ML IJ SOLN
40.0000 mg | Freq: Once | INTRAMUSCULAR | Status: AC
  Administered 2013-06-23: 40 mg via INTRAVENOUS

## 2013-06-23 MED ORDER — LOSARTAN POTASSIUM 50 MG PO TABS
50.0000 mg | ORAL_TABLET | Freq: Every day | ORAL | Status: DC
  Administered 2013-06-23 – 2013-06-24 (×2): 50 mg via ORAL
  Filled 2013-06-23 (×3): qty 1

## 2013-06-23 MED ORDER — FUROSEMIDE 10 MG/ML IJ SOLN
INTRAMUSCULAR | Status: AC
  Filled 2013-06-23: qty 4

## 2013-06-23 MED ORDER — FLEET ENEMA 7-19 GM/118ML RE ENEM
1.0000 | ENEMA | Freq: Every day | RECTAL | Status: DC | PRN
  Filled 2013-06-23: qty 1

## 2013-06-23 MED ORDER — PROPRANOLOL HCL 10 MG PO TABS
10.0000 mg | ORAL_TABLET | Freq: Two times a day (BID) | ORAL | Status: DC
  Administered 2013-06-23 – 2013-06-26 (×6): 10 mg via ORAL
  Filled 2013-06-23 (×7): qty 1

## 2013-06-23 MED FILL — Electrolyte-R (PH 7.4) Solution: INTRAVENOUS | Qty: 3000 | Status: AC

## 2013-06-23 MED FILL — Sodium Chloride Irrigation Soln 0.9%: Qty: 3000 | Status: AC

## 2013-06-23 MED FILL — Sodium Bicarbonate IV Soln 8.4%: INTRAVENOUS | Qty: 50 | Status: AC

## 2013-06-23 MED FILL — Lidocaine HCl IV Inj 20 MG/ML: INTRAVENOUS | Qty: 5 | Status: AC

## 2013-06-23 MED FILL — Heparin Sodium (Porcine) Inj 1000 Unit/ML: INTRAMUSCULAR | Qty: 20 | Status: AC

## 2013-06-23 MED FILL — Mannitol IV Soln 20%: INTRAVENOUS | Qty: 500 | Status: AC

## 2013-06-23 NOTE — Progress Notes (Signed)
Pt ambulated 150 ft with rolling walker, O2 at 3L, RN and family member. Pt tolerated well.  Stopped for a brief rest twice due to fatigue. Pt returned to bed in room on 3L O2.

## 2013-06-23 NOTE — Progress Notes (Signed)
Weaned pt down to 1L [place]. Pt's sats 97%. Will continue to try to wean off oxygen.

## 2013-06-23 NOTE — Progress Notes (Addendum)
      [address]       [place] [address]             [phone]        2 Days Post-Op Procedure(s) (LRB): CORONARY ARTERY BYPASS GRAFTING (CABG) (N/A) INTRAOPERATIVE TRANSESOPHAGEAL ECHOCARDIOGRAM (N/A)  Subjective: Patient tired this am. He just finished breakfast.  Objective: Vital signs in last 24 hours: Temp:  [98.3 F (36.8 C)-99.9 F (37.7 C)] 98.8 F (37.1 C) (10/22 0333) Pulse Rate:  [58-70] 68 (10/22 0333) Cardiac Rhythm:  [-] Sinus bradycardia (10/21 1930) Resp:  [18-27] 18 (10/22 0333) BP: (111-155)/(62-79) 155/79 mmHg (10/22 0333) SpO2:  [92 %-96 %] 92 % (10/22 0333) Arterial Line BP: (127-155)/(60-79) 155/79 mmHg (10/21 1000) Weight:  [114.8 kg (253 lb 1.4 oz)] 114.8 kg (253 lb 1.4 oz) (10/22 0333)  Pre op weight 113 kg Current Weight  06/23/13 114.8 kg (253 lb 1.4 oz)    Hemodynamic parameters for last 24 hours: PAP: (29-40)/(15-23) 38/22 mmHg  Intake/Output from previous day: 10/21 0701 - 10/22 0700 In: 545.2 [P.O.:440; I.V.:105.2] Out: 1325 [Urine:1215; Chest Tube:110]   Physical Exam:  Cardiovascular: RRR, no murmurs, gallops, or rubs. Pulmonary: Diminished at bases; no rales, wheezes, or rhonchi. Abdomen: Soft, non tender, protuberant,bowel sounds present. Extremities: Mild bilateral lower extremity edema. Wounds: RLE wounds are clean and dry.  No erythema or signs of infection. Sternal dressing is clean and dry.  Lab Results: CBC: Recent Labs  06/22/13 0408 06/23/13 0505  WBC 13.6* 16.9*  HGB 12.8* 12.8*  HCT 36.0* 37.1*  PLT 141* 153   BMET:  Recent Labs  06/22/13 0408 06/23/13 0505  NA 140 134*  K 4.0 4.0  CL 107 98  CO2 22 26  GLUCOSE 129* 123*  BUN 13 12  CREATININE 0.85 0.83  CALCIUM 8.0* 8.4    PT/INR:  Lab Results  Component Value Date   INR 1.38 06/21/2013   INR 1.1* 06/14/2013   ABG:  INR: Will add last result for INR, ABG once components are confirmed Will add last 4 CBG results  once components are confirmed  Assessment/Plan:  1. CV - SR. Not on a BB as had previous bradycardia and was paced. Hypertensive so will restart Losartan.  2.  Pulmonary - CXR this am shows left base atelectasis, no pneumothorax, left pleural effusion.On 3 liters of oxygen via . Wean as tolerates.Encourage incentive spirometer and flutter valve. 3. Volume Overload - On Lasix 40 daily. Will give IV this am. 4.  Acute blood loss anemia - H and H stable at 12.8 and 37.1 5.Thrombocytopenia resolved-platelets up to 153,000. 6.CBGs 110/129/119. Pre op HGA1C 5.7. Is pre diabetic.Will stop accu checks and SS PRN. 7.Continue CRPI  ZIMMERMAN,DONIELLE MPA-C 06/23/2013,8:12 AM   Chart reviewed, patient examined, agree with above. HR is back up to 80's. 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

## 2013-06-23 NOTE — Progress Notes (Signed)
CARDIAC REHAB PHASE I   PRE:  Rate/Rhythm: 94Sr  BP:  Supine:   Sitting: 120/80  Standing:    SaO2: 92%3L  MODE:  Ambulation: 350 ft   POST:  Rate/Rhythm: 98SR  BP:  Supine:   Sitting: 130/82  Standing:    SaO2: 95%3L 1022-1100 Pt walked 350 ft on 3L with rolling walker and asst x 1. Gait steady. Tired by end of walk. C/o slightly lightheaded after walk but BP good. To recliner with call bell. Used IS after walk. Call bell in reach.   Luetta Nutting, RN BSN  06/23/2013 10:58 AM

## 2013-06-23 NOTE — Progress Notes (Signed)
Pt ambulated with walker approx. 350 feet. Pt tolerated well on room air- sats 93% during ambulating. Will leave on room air. While at rest O2 stats on room air were 94%.

## 2013-06-24 LAB — CBC
HCT: 36.4 % — ABNORMAL LOW (ref 39.0–52.0)
Hemoglobin: 12.6 g/dL — ABNORMAL LOW (ref 13.0–17.0)
MCH: 32.2 pg (ref 26.0–34.0)
MCV: 93.1 fL (ref 78.0–100.0)
RBC: 3.91 MIL/uL — ABNORMAL LOW (ref 4.22–5.81)
WBC: 14.6 10*3/uL — ABNORMAL HIGH (ref 4.0–10.5)

## 2013-06-24 MED ORDER — ENOXAPARIN SODIUM 40 MG/0.4ML ~~LOC~~ SOLN
40.0000 mg | SUBCUTANEOUS | Status: DC
  Administered 2013-06-24 – 2013-06-25 (×2): 40 mg via SUBCUTANEOUS
  Filled 2013-06-24 (×3): qty 0.4

## 2013-06-24 MED ORDER — LACTULOSE 10 GM/15ML PO SOLN
20.0000 g | Freq: Once | ORAL | Status: AC
  Administered 2013-06-24: 20 g via ORAL
  Filled 2013-06-24 (×2): qty 30

## 2013-06-24 NOTE — Discharge Summary (Signed)
Physician Discharge Summary       [address] [place]       [place] [address]             [phone]    Patient ID: Dale Lynch MRN: [id number] DOB/AGE: 1941-05-13 72 y.o.  Admit date: 06/16/2013 Discharge date: 06/24/2013  Admission Diagnoses: 1. CAD 2.History of hypertension 3.History of dyslipidemia 4.History of obesity 5.History of brainstem stroke 6.History of AAA(96') 7.History of tobacco abuse 8.History of essential tremor  Discharge Diagnoses:  1. CAD 2.History of hypertension 3.History of dyslipidemia 4.History of obesity 5.History of brainstem stroke 6.History of AAA(96') 7.History of tobacco abuse 8.History of essential tremor  Procedure (s):  Cardiac catheterization done by Dr. Eldridge Dace on 06/16/2013: The left main coronary artery is widely patent.  The left anterior descending artery is a large vessel. The midportion has moderate diffuse disease followed by a focal 90% lesion. The mid to distal LAD is medium size and caliber but appears widely patent. It is fairly tortuous. There is a large diagonal branch which appears widely patent. There is a large septal arcade which is patent as well. The LAD appears to give collaterals to the PDA.  The left circumflex artery is a large vessel. There is a large first obtuse marginal with an ostial 70% stenosis. Just after the marginal branch, there is a 99% stenosis in the midcircumflex. The OM 2 is medium size and patent..  The right coronary artery is a large dominant vessel. There is a 50% ostial stenosis. There are sequential 90% stenoses in the proximal to mid vessel. In the distal vessel, there is a subtotal occlusion. The distal RCA fills antegrade as well as from left to right collaterals. Competitive flow is evident. The PDA appears to be a reasonably sized vessel.  LEFT VENTRICULOGRAM: Left ventricular angiogram was done in the 30 RAO projection and revealed normal left ventricular wall motion and  systolic function with an estimated ejection fraction of 60%. LVEDP was 11 mmHg  1. Median Sternotomy 2. Extracorporeal circulation 3. Coronary artery bypass grafting x 5  Left internal mammary graft to the LAD  SVG to D2  Seq SVG to D1 and OM1  SVG to PDA 4. Endoscopic vein harvest from the right leg  5. Insertion of left femoral arterial line by Dr. Laneta Simmers on 06/21/2013.   History of Presenting Illness: The patient is a 72 year old gentleman who reports a several month history of atypical muscular type pain across his anterior chest that he thought may be due to statin. He stopped this and that pain resolved but then he noticed a different substernal chest tightness with exertion, resolving with rest. 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 Cath was done and shows severe multi-vessel disease.  Brief Hospital Course:  The patient was extubated the evening of surgery without difficulty. He remained afebrile and hemodynamically stable. Theone Murdoch, a line, chest tubes, and foley were removed early in the post operative course. He initially was bradycardic and paced.He was volume over loaded and diuresed. He was weaned off the insulin drip.The patient's HGA1C pre op was 5.7. The patient was felt surgically stable for transfer from the ICU to [facility]U for further convalescence on 06/22/2013. He was restarted on low dose propanolol for essential tremor.He continues to progress with cardiac rehab. He was ambulating on room air. He has been tolerating a diet and has had  a bowel movement. Epicardial pacing wires will be removed prior to discharge. Provided the patient remains afebrile, hemodynamically stable, and pending morning round evaluation, He will be surgically stable for discharge on 06/26/2013.    Latest Vital Signs: Blood pressure 127/67, pulse 65, temperature 98.4 F (36.9 C), temperature source Oral,  resp. rate 18, height 5\' 10"  (1.778 m), weight 111.857 kg (246 lb 9.6 oz), SpO2 95.00%.  Physical Exam: Cardiovascular: RRR, no murmurs, gallops, or rubs.  Pulmonary: Diminished at bases; no rales, wheezes, or rhonchi.  Abdomen: Soft, non tender, protuberant,bowel sounds present.  Extremities: Mild bilateral lower extremity edema.  Wounds: Clean and dry. No erythema or signs of infection.    Discharge Condition:Stable  Recent laboratory studies:  Lab Results  Component Value Date   WBC 14.6* 06/24/2013   HGB 12.6* 06/24/2013   HCT 36.4* 06/24/2013   MCV 93.1 06/24/2013   PLT 170 06/24/2013   Lab Results  Component Value Date   NA 134* 06/23/2013   K 4.0 06/23/2013   CL 98 06/23/2013   CO2 26 06/23/2013   CREATININE 0.83 06/23/2013   GLUCOSE 123* 06/23/2013      Diagnostic Studies: Dg Chest 2 View  06/23/2013   CLINICAL DATA:  Status post coronary bypass grafting, chest pain  EXAM: CHEST  2 VIEW  COMPARISON:  06/22/2013  FINDINGS: Mediastinal drain and left thoracostomy catheter as well as the Swan-Ganz catheter have been removed in the interval. No recurrent pneumothorax is seen. Minimal left basilar atelectasis is noted. The cardiac shadow is stable.  IMPRESSION: Minimal left basilar atelectasis.   Electronically Signed   By: Alcide Clever M.D.   On: 06/23/2013 07:30     Future Appointments Provider Department Dept Phone   08/09/2013 10:00 AM York Spaniel, MD [facility] [phone]   09/07/2013 8:45 AM Max Maud Deed, DPM [facility] [phone]      Discharge Medications:    Medication List    STOP taking these medications       hydrochlorothiazide 25 MG tablet  Commonly known as:  HYDRODIURIL     isosorbide mononitrate 30 MG 24 hr tablet  Commonly known as:  IMDUR     nitroGLYCERIN 0.4 MG SL tablet  Commonly known as:  NITROSTAT      TAKE these medications       aspirin 325 MG EC tablet  Take 1 tablet (325 mg  total) by mouth daily.     celecoxib 200 MG capsule  Commonly known as:  CELEBREX  Take 200 mg by mouth daily.     clopidogrel 75 MG tablet  Commonly known as:  PLAVIX  Take 75 mg by mouth daily.     eucerin cream  Apply topically 2 (two) times daily as needed for dry skin.     fluticasone 50 MCG/ACT nasal spray  Commonly known as:  FLONASE  Place 1 spray into the nose daily.     Folic Acid-Vit B6-Vit B12 2.5-25-1 MG Tabs tablet  Commonly known as:  FOLBEE  Take 1 tablet by mouth daily.     furosemide 40 MG tablet  Commonly known as:  LASIX  Take 1 tablet (40 mg total) by mouth daily. For 3 Days     hydrOXYzine 10 MG tablet  Commonly known as:  ATARAX/VISTARIL  Take 10 mg by mouth 2 (two) times daily.     losartan 100 MG tablet  Commonly known as:  COZAAR  Take 100 mg  by mouth daily.     oxyCODONE 5 MG immediate release tablet  Commonly known as:  Oxy IR/ROXICODONE  Take 1-2 tablets (5-10 mg total) by mouth every 3 (three) hours as needed.     potassium chloride SA 20 MEQ tablet  Commonly known as:  K-DUR,KLOR-CON  Take 1 tablet (20 mEq total) by mouth daily. For 3 Days     propranolol 20 MG tablet  Commonly known as:  INDERAL  Take 20 mg by mouth 2 (two) times daily.     simvastatin 10 MG tablet  Commonly known as:  ZOCOR  Take 10 mg by mouth at bedtime.          The patient has been discharged on:   1.Beta Blocker:  Yes [  x ]                              No   [   ]                              If No, reason:  2.Ace Inhibitor/ARB: Yes [ x  ]                                     No  [    ]                                     If No, reason:  3.Statin:   Yes [  x ]                  No  [   ]                  If No, reason:  4.Ecasa:  Yes  [ x  ]                  No   [   ]                  If No, reason:  Follow Up Appointments:     Follow-up Information   Follow up with Corky Crafts., MD. (Call for a follow up for 2 weeks)     Specialty:  Cardiology   Contact information:   [address] [place] [address] [phone]       Follow up with Alleen Borne, MD. (PA/LAT CXR to be taken (at [facility] which is in the same building as Dr. Sharee Pimple office) one hour prior to office appointment;Office will mail appointment date and time )    Specialty:  Cardiothoracic Surgery   Contact information:   [address] [place] [address] [phone]       Follow up with Darrow Bussing, MD. (Call for a follow up appointment regarding further surveillance of pre op HGA1C 5.7)    Specialty:  Family Medicine   Contact information:   [address] [place] [address] [phone]       Signed: Doree Fudge MPA-C 06/24/2013, 8:59 AM

## 2013-06-24 NOTE — Progress Notes (Signed)
Pt ambulated in hall way approx. 510ft pt tolerate well.

## 2013-06-24 NOTE — Progress Notes (Signed)
CARDIAC REHAB PHASE I   PRE:  Rate/Rhythm: 80 SR    BP: sitting 110/70    SaO2: 88-91 RA on finger  MODE:  Ambulation: 550  ft   POST:  Rate/Rhythm: 82 SR    BP: sitting 120/60     SaO2: 95-97 RA on ear   Pt tired this am. No BM yet. Willing to walk. Used RW, fairly independently. Reminders not to pull with arms to get to edge of chair. SaO2 low before walk on fingers but mid 90s after walk on ear (fingers too cold). Return to recliner. Pt fatigued, didn't want to talk.  [phone]  Elissa Lovett [place] CES, ACSM 06/24/2013 10:45 AM

## 2013-06-24 NOTE — Progress Notes (Addendum)
      [address]       [place] [address]             [phone]        3 Days Post-Op Procedure(s) (LRB): CORONARY ARTERY BYPASS GRAFTING (CABG) (N/A) INTRAOPERATIVE TRANSESOPHAGEAL ECHOCARDIOGRAM (N/A)  Subjective:  Had brief nausea last evening. Patient passing flatus but no bowel movement yet, despite being given fleet enema.  Objective: Vital signs in last 24 hours: Temp:  [98.4 F (36.9 C)-98.9 F (37.2 C)] 98.4 F (36.9 C) (10/23 0447) Pulse Rate:  [65-74] 65 (10/23 0447) Cardiac Rhythm:  [-] Sinus bradycardia;Normal sinus rhythm (10/23 0720) Resp:  [18] 18 (10/23 0447) BP: (126-127)/(67-77) 127/67 mmHg (10/23 0447) SpO2:  [93 %-97 %] 95 % (10/23 0447) Weight:  [111.857 kg (246 lb 9.6 oz)] 111.857 kg (246 lb 9.6 oz) (10/23 0447)  Pre op weight 113 kg Current Weight  06/24/13 111.857 kg (246 lb 9.6 oz)      Intake/Output from previous day: 10/22 0701 - 10/23 0700 In: 240 [P.O.:240] Out: 1425 [Urine:1425]   Physical Exam:  Cardiovascular: RRR, no murmurs, gallops, or rubs. Pulmonary: Diminished at bases; no rales, wheezes, or rhonchi. Abdomen: Soft, non tender, protuberant,bowel sounds present. Extremities: Mild bilateral lower extremity edema. Wounds: Clean and dry.  No erythema or signs of infection.   Lab Results: CBC:  Recent Labs  06/23/13 0505 06/24/13 0330  WBC 16.9* 14.6*  HGB 12.8* 12.6*  HCT 37.1* 36.4*  PLT 153 170   BMET:   Recent Labs  06/22/13 0408 06/23/13 0505  NA 140 134*  K 4.0 4.0  CL 107 98  CO2 22 26  GLUCOSE 129* 123*  BUN 13 12  CREATININE 0.85 0.83  CALCIUM 8.0* 8.4    PT/INR:  Lab Results  Component Value Date   INR 1.38 06/21/2013   INR 1.1* 06/14/2013   ABG:  INR: Will add last result for INR, ABG once components are confirmed Will add last 4 CBG results once components are confirmed  Assessment/Plan:  1. CV - Bradycardia/SR.Continue with Losartan. On Propanolol for familial  tremor 2.  Pulmonary - Weaned to room air.Encourage incentive spirometer and flutter valve. 3. Volume Overload - Continue Lasix 40 daily.  4.  Acute blood loss anemia - H and H stable at 12.6 and 36.4 5.Thrombocytopenia resolved-platelets up to 170,000. 6.Remove EPW in am 7.LOC constipation 8.Continue CRPI 9.Possible home 1-2 days  ZIMMERMAN,DONIELLE MPA-C 06/24/2013,8:15 AM  Chart reviewed, patient examined, agree with above. He is doing well but has not had a BM yet. He received lactulose today. He was on Plavix 75 mg per day previously for a history of stroke. This should be restarted at the time of discharge.

## 2013-06-25 MED ORDER — LOSARTAN POTASSIUM 50 MG PO TABS
100.0000 mg | ORAL_TABLET | Freq: Every day | ORAL | Status: DC
  Administered 2013-06-25 – 2013-06-26 (×2): 100 mg via ORAL
  Filled 2013-06-25 (×2): qty 2

## 2013-06-25 MED ORDER — LISINOPRIL 5 MG PO TABS: 5.0000 mg | ORAL_TABLET | Freq: Every day | ORAL | Status: DC

## 2013-06-25 NOTE — Progress Notes (Signed)
Doing well. Post OP CABG  While walking, needed to have BM. Current in bathroom. States he is fine.   Mild Brady on [place].

## 2013-06-25 NOTE — Care Management Note (Signed)
    Page 1 of 1   06/25/2013     3:51:44 PM   CARE MANAGEMENT NOTE 06/25/2013  Patient:  Dale Lynch, Dale Lynch   Account Number:  [id number]  Date Initiated:  06/21/2013  Documentation initiated by:  Avie Arenas  Subjective/Objective Assessment:   3VCAD post cath - CABG 06-21-13     Action/Plan:   Anticipated DC Date:  06/25/2013   Anticipated DC Plan:  HOME W HOME HEALTH SERVICES      DC Planning Services  CM consult      Choice offered to / List presented to:     DME arranged  Levan Hurst      DME agency  [facility]        Status of service:  Completed, signed off Medicare Important Message given?   (If response is "NO", the following Medicare IM given date fields will be blank) Date Medicare IM given:   Date Additional Medicare IM given:    Discharge Disposition:  HOME/SELF CARE  Per UR Regulation:  Reviewed for med. necessity/level of care/duration of stay  If discussed at Long Length of Stay Meetings, dates discussed:   06/24/2013    Comments:  ContactMaxson, Oddo Spouse [phone]   [phone]  06/24/13 Kaiyana Bedore,RN,BSN [phone] PT FOR LIKELY DC ON 06/25/13.  REQUESTS RW FOR HOME. REFERRAL TO [facility] FOR DME NEEDS.

## 2013-06-25 NOTE — Progress Notes (Signed)
[phone] Cardiac Rehab Completed discharge education with pt and wife. They voice understanding.  Beatrix Fetters 11:51 AM 06/25/2013

## 2013-06-25 NOTE — Progress Notes (Addendum)
      [address]       [place] [address]             [phone]      4 Days Post-Op Procedure(s) (LRB): CORONARY ARTERY BYPASS GRAFTING (CABG) (N/A) INTRAOPERATIVE TRANSESOPHAGEAL ECHOCARDIOGRAM (N/A)  Subjective:  [name] states he feels weak this morning.  He thinks he would do better to stay in the hospital one more day.  He is ambulating with use of a walker. + BM  Objective: Vital signs in last 24 hours: Temp:  [97.9 F (36.6 C)-98.4 F (36.9 C)] 98.2 F (36.8 C) (10/24 0418) Pulse Rate:  [62-70] 70 (10/24 0418) Cardiac Rhythm:  [-] Sinus bradycardia (10/23 1930) Resp:  [18] 18 (10/24 0418) BP: (120-133)/(60-75) 133/75 mmHg (10/24 0418) SpO2:  [93 %-96 %] 96 % (10/24 0418) Weight:  [245 lb 3.2 oz (111.222 kg)] 245 lb 3.2 oz (111.222 kg) (10/24 0418)  Intake/Output from previous day: 10/23 0701 - 10/24 0700 In: 723 [P.O.:720; I.V.:3] Out: 100 [Urine:100]  General appearance: alert, cooperative and no distress Heart: regular rate and rhythm Lungs: clear to auscultation bilaterally Abdomen: soft, non-tender; bowel sounds normal; no masses,  no organomegaly Extremities: edema trace Wound: clean and dry  Lab Results:  Recent Labs  06/23/13 0505 06/24/13 0330  WBC 16.9* 14.6*  HGB 12.8* 12.6*  HCT 37.1* 36.4*  PLT 153 170   BMET:  Recent Labs  06/23/13 0505  NA 134*  K 4.0  CL 98  CO2 26  GLUCOSE 123*  BUN 12  CREATININE 0.83  CALCIUM 8.4    PT/INR: No results found for this basename: LABPROT, INR,  in the last 72 hours ABG    Component Value Date/Time   PHART 7.342* 06/21/2013 1946   HCO3 23.2 06/21/2013 1946   TCO2 22 06/21/2013 1951   ACIDBASEDEF 2.0 06/21/2013 1946   O2SAT 95.0 06/21/2013 1946   CBG (last 3)   Recent Labs  06/22/13 1624 06/22/13 2114 06/23/13 0617  GLUCAP 110* 129* 119*    Assessment/Plan: S/P Procedure(s) (LRB): CORONARY ARTERY BYPASS GRAFTING (CABG) (N/A) INTRAOPERATIVE  TRANSESOPHAGEAL ECHOCARDIOGRAM (N/A)  1. CV- NSR rate controlled, pressure remains mildly elevated- on Losartan, Propanolol for tremor,  2. Pulm- off oxygen, no acute issues continue IS 3. Renal-creatinine stable, volume status stable, on Lasix 4. LOC constipation- resolved 5. Dispo- patient stable, will d/c EPW today, hopefully home in AM   LOS: 9 days    BARRETT, ERIN 06/25/2013   Chart reviewed, patient examined, agree with above. He feels better this afternoon. Pacing wires are out. Plan home in am.

## 2013-06-25 NOTE — Progress Notes (Signed)
Removed EPW per order; ends were intact. No bleeding noted. No arrhythmias overnight. Pt tolerated well and is on bed rest for one hour with frequent vitals set up.

## 2013-06-25 NOTE — Progress Notes (Signed)
CARDIAC REHAB PHASE I   PRE:  Rate/Rhythm: 65 SR  BP:  Supine:   Sitting: 112/60  Standing:    SaO2: 93 RA  MODE:  Ambulation: 200 ft   POST:  Rate/Rhythm: 79 SR  BP:  Supine:   Sitting:   Standing: Unable to measure, had a bathroom emergency   SaO2: 89 RA Unable to walk a long distance, patient has had several laxatives and it seem his bowels are beginning to work and had to return to room quickly.  Walking independently with a walker.  Would like wife to be present for discharge education. Cindra Eves RN, BSN [phone]  06/25/2013 10:18 AM

## 2013-06-26 DIAGNOSIS — I2510 Atherosclerotic heart disease of native coronary artery without angina pectoris: Secondary | ICD-10-CM

## 2013-06-26 MED ORDER — POTASSIUM CHLORIDE CRYS ER 20 MEQ PO TBCR: 20.0000 meq | EXTENDED_RELEASE_TABLET | Freq: Every day | ORAL | Status: DC

## 2013-06-26 MED ORDER — OXYCODONE HCL 5 MG PO TABS: 5.0000 mg | ORAL_TABLET | ORAL | Status: DC | PRN

## 2013-06-26 MED ORDER — ASPIRIN 325 MG PO TBEC: 325.0000 mg | DELAYED_RELEASE_TABLET | Freq: Every day | ORAL | Status: DC

## 2013-06-26 MED ORDER — FUROSEMIDE 40 MG PO TABS: 40.0000 mg | ORAL_TABLET | Freq: Every day | ORAL | Status: DC

## 2013-06-26 NOTE — Progress Notes (Signed)
      [address]       [place] [address]             [phone]      5 Days Post-Op Procedure(s) (LRB): CORONARY ARTERY BYPASS GRAFTING (CABG) (N/A) INTRAOPERATIVE TRANSESOPHAGEAL ECHOCARDIOGRAM (N/A)  Subjective:  Dale Lynch has no complaints this morning.  He states he would like to go home today. + Ambulation + BM  Objective: Vital signs in last 24 hours: Temp:  [97.7 F (36.5 C)-98.7 F (37.1 C)] 98.7 F (37.1 C) (10/25 0506) Pulse Rate:  [60-81] 60 (10/25 0506) Cardiac Rhythm:  [-] Normal sinus rhythm;Sinus bradycardia (10/25 0811) Resp:  [18] 18 (10/25 0506) BP: (114-136)/(58-70) 136/70 mmHg (10/25 0506) SpO2:  [94 %-97 %] 94 % (10/25 0506) Weight:  [242 lb 8.1 oz (110 kg)] 242 lb 8.1 oz (110 kg) (10/25 0506)  Intake/Output from previous day: 10/24 0701 - 10/25 0700 In: 720 [P.O.:720] Out: -   General appearance: alert, cooperative and no distress Heart: regular rate and rhythm Lungs: clear to auscultation bilaterally Abdomen: soft, non-tender; bowel sounds normal; no masses,  no organomegaly Extremities: edema trace Wound: clean and dry  Lab Results:  Recent Labs  06/24/13 0330  WBC 14.6*  HGB 12.6*  HCT 36.4*  PLT 170   BMET: No results found for this basename: NA, K, CL, CO2, GLUCOSE, BUN, CREATININE, CALCIUM,  in the last 72 hours  PT/INR: No results found for this basename: LABPROT, INR,  in the last 72 hours ABG    Component Value Date/Time   PHART 7.342* 06/21/2013 1946   HCO3 23.2 06/21/2013 1946   TCO2 22 06/21/2013 1951   ACIDBASEDEF 2.0 06/21/2013 1946   O2SAT 95.0 06/21/2013 1946   CBG (last 3)  No results found for this basename: GLUCAP,  in the last 72 hours  Assessment/Plan: S/P Procedure(s) (LRB): CORONARY ARTERY BYPASS GRAFTING (CABG) (N/A) INTRAOPERATIVE TRANSESOPHAGEAL ECHOCARDIOGRAM (N/A)  1. CV- NSR rate and pressure controlled- continue Propanolol and Losartan 2. Pulm- off oxygen, no acute issues  continue IS 3. Renal- volume status is back to baseline, on Lasix 4. Dispo- patient doing very well, will d/c home today   LOS: 10 days    Raford Pitcher, Dale Lynch 06/26/2013

## 2013-06-26 NOTE — Progress Notes (Signed)
He is in good spirits. Going home this morning. Followup arranged with Dr.Varanasi.

## 2013-07-08 ENCOUNTER — Encounter: Payer: Self-pay | Admitting: Interventional Cardiology

## 2013-07-08 ENCOUNTER — Ambulatory Visit (INDEPENDENT_AMBULATORY_CARE_PROVIDER_SITE_OTHER): Payer: Medicare Other | Admitting: Interventional Cardiology

## 2013-07-08 VITALS — BP 147/90 | HR 61 | Ht 69.0 in | Wt 233.0 lb

## 2013-07-08 DIAGNOSIS — I2510 Atherosclerotic heart disease of native coronary artery without angina pectoris: Secondary | ICD-10-CM

## 2013-07-08 DIAGNOSIS — E785 Hyperlipidemia, unspecified: Secondary | ICD-10-CM

## 2013-07-08 DIAGNOSIS — I10 Essential (primary) hypertension: Secondary | ICD-10-CM

## 2013-07-08 MED ORDER — SIMVASTATIN 10 MG PO TABS: 10.0000 mg | ORAL_TABLET | Freq: Every day | ORAL | Status: DC

## 2013-07-08 NOTE — Progress Notes (Signed)
Patient ID: Dale Lynch, male   DOB: 08-15-1941, 72 y.o.   MRN: [id number]    [address] [place], [place]  [address] Phone: [phone] Fax:  [phone]  Date:  07/08/2013   ID:  Dale Lynch, DOB Aug 21, 1941, MRN [id number]  PCP:  Darrow Bussing, MD      History of Present Illness: Dale Lynch is a 72 y.o. male who had angina prompting cath.  He had 3 vessel disease and had CABG.  he has done very well since the surgery. He has lost some weight. He is back to walking 3 miles a day over the course of 2 sessions. He does not have any of the angina that he had prior to his procedure. His chest discomfort from the surgery has improved greatly. He had some leg pain at the vein harvest site but this is also improved. Overall, he feels quite well given that he just had surgery very recently.  He tells me that he and his wife are planning on moving to [place] in 2015.   Wt Readings from Last 3 Encounters:  07/08/13 233 lb (105.688 kg)  06/26/13 242 lb 8.1 oz (110 kg)  06/26/13 242 lb 8.1 oz (110 kg)     Past Medical History  Diagnosis Date  . Tremor   . Hypertension   . Obese   . Dyslipidemia   . Cerebrovascular disease   . Other and unspecified angina pectoris   . AAA (abdominal aortic aneurysm) 1996  . Brainstem stroke 2004    "one of my vertebral arteries occluded" ,denies residual on Jul 06, 2013  . Arthritis     "joints" (07/06/2013)  . Nephrolithiasis 1977    "passed on their own" (2013/07/06)  . Essential tremor     Current Outpatient Prescriptions  Medication Sig Dispense Refill  . acetaminophen (TYLENOL) 500 MG tablet Take 500 mg by mouth every 6 (six) hours as needed.      Marland Kitchen aspirin EC 325 MG EC tablet Take 1 tablet (325 mg total) by mouth daily.  30 tablet  0  . celecoxib (CELEBREX) 200 MG capsule Take 200 mg by mouth daily.      . clopidogrel (PLAVIX) 75 MG tablet Take 75 mg by mouth daily.      . fluticasone (FLONASE) 50 MCG/ACT nasal spray Place 1  spray into the nose daily.       . Folic Acid-Vit B6-Vit B12 (FOLBEE) 2.5-25-1 MG TABS Take 1 tablet by mouth daily.      . hydrOXYzine (ATARAX/VISTARIL) 10 MG tablet Take 10 mg by mouth 2 (two) times daily.      Marland Kitchen losartan (COZAAR) 100 MG tablet Take 100 mg by mouth daily.      . propranolol (INDERAL) 20 MG tablet Take 20 mg by mouth 2 (two) times daily.       . simvastatin (ZOCOR) 10 MG tablet Take 10 mg by mouth at bedtime.       . Skin Protectants, Misc. (EUCERIN) cream Apply topically 2 (two) times daily as needed for dry skin.       No current facility-administered medications for this visit.    Allergies:    Allergies  Allergen Reactions  . Penicillins   . Mysoline [Primidone]   . Codeine Other (See Comments)    hallucinate    Social History:  The patient  reports that he has quit smoking. His smoking use included Cigarettes. He has a 15 pack-year smoking history.  He has never used smokeless tobacco. He reports that he drinks about 3.6 ounces of alcohol per week. He reports that he does not use illicit drugs.   Family History:  The patient's family history includes Cancer in his mother; Lung cancer in his father; Tremor in his brother and mother.   ROS:  Please see the history of present illness.  No nausea, vomiting.  No fevers, chills.  No focal weakness.  No dysuria.    All other systems reviewed and negative.   PHYSICAL EXAM: VS:  BP 147/90  Pulse 61  Ht 5\' 9"  (1.753 m)  Wt 233 lb (105.688 kg)  BMI 34.39 kg/m2 Well nourished, well developed, in no acute distress HEENT: normal Neck: no JVD, no carotid bruits Cardiac:  normal S1, S2; RRR; sternal incision healing very well Lungs:  clear to auscultation bilaterally, no wheezing, rhonchi or rales Abd: soft, nontender, no hepatomegaly Ext: no edema, Mild bruising at the vein harvest site in the right Skin: warm and dry Neuro:   no focal abnormalities noted     ASSESSMENT AND PLAN:  1. CAD: Angina resolved post CABG.   Continue Plavix.  Stop Celebrex.  She will use Tylenol for pain if needed. Continue aggressive secondary prevention. He is committed to trying to lose weight and change his lifestyle.  I recommended cardiac rehabilitation.  2. Hyperlipidemia: Refill simvastatin.  LDL target less than 100.  3. HTN: Check BP at home or at the drug store. Blood pressure will improve with continued weight loss and diet control.  I will see him back in about 3 months. We'll reassess lipids at that time.  Signed, Fredric Mare, MD, [facility] 07/08/2013 11:39 AM

## 2013-07-08 NOTE — Patient Instructions (Signed)
Your physician recommends that you schedule a follow-up appointment in: 3 months with Dr. Eldridge Dace.  Your physician has recommended you make the following change in your medication:   1. Stop Celebrex.  2. Take Tylenol as needed instead.  Refilled simvastatin for a 90 day supply to your pharmacy.

## 2013-07-14 ENCOUNTER — Ambulatory Visit (INDEPENDENT_AMBULATORY_CARE_PROVIDER_SITE_OTHER): Payer: Medicare Other | Admitting: Neurology

## 2013-07-14 ENCOUNTER — Encounter: Payer: Self-pay | Admitting: Neurology

## 2013-07-14 VITALS — BP 110/74 | HR 62 | Wt 237.0 lb

## 2013-07-14 DIAGNOSIS — G250 Essential tremor: Secondary | ICD-10-CM

## 2013-07-14 MED ORDER — ALPRAZOLAM 0.5 MG PO TABS: 0.5000 mg | ORAL_TABLET | Freq: Every evening | ORAL | Status: DC | PRN

## 2013-07-14 NOTE — Progress Notes (Signed)
Reason for visit: Tremor  Dale Lynch is an 72 y.o. male  History of present illness:  Dale Lynch is a 72 year old right-handed white male with a history of a benign essential tremor. The patient has a tremor affecting both upper extremities and the head and neck. The patient has difficulty feeding himself, and difficulty with handwriting. The patient has been tried on a multitude of medications that were either not tolerated or did not work. The patient was given a trial on clonazepam when last seen, but he did not continue the medication as it was ineffective. The patient recently has had a coronary artery bypass procedure, and he is recovering from this. The patient does have some mild balance issues, and he uses a cane for ambulation. The patient denies any falls. The patient returns to this office for an evaluation.  Past Medical History  Diagnosis Date  . Gait disorder   . Hypertension   . Obese   . Dyslipidemia   . Cerebrovascular disease     Left vertebral artery occlusion  . Other and unspecified angina pectoris   . AAA (abdominal aortic aneurysm) 1996  . Brainstem stroke 2004    "one of my vertebral arteries occluded" ,denies residual on 2013-07-05  . Arthritis     "joints" (07/05/13)  . Nephrolithiasis 1977    "passed on their own" (July 05, 2013)  . Essential tremor     Past Surgical History  Procedure Laterality Date  . Abdominal aortic aneurysm repair  1996  . Coronary angioplasty with stent placement  05-Jul-2013  . Cholecystectomy  1996  . Hernia repair  1996    "belly button" (2013-07-05)  . Coronary artery bypass graft N/A 06/21/2013    Procedure: CORONARY ARTERY BYPASS GRAFTING (CABG);  Surgeon: Alleen Borne, MD;  Location: [facility] OR;  Service: Open Heart Surgery;  Laterality: N/A;  . Intraoperative transesophageal echocardiogram N/A 06/21/2013    Procedure: INTRAOPERATIVE TRANSESOPHAGEAL ECHOCARDIOGRAM;  Surgeon: Alleen Borne, MD;  Location: [facility] OR;   Service: Open Heart Surgery;  Laterality: N/A;    Family History  Problem Relation Age of Onset  . Cancer Mother   . Tremor Mother   . Lung cancer Father   . Tremor Brother     Social history:  reports that he has quit smoking. His smoking use included Cigarettes. He has a 15 pack-year smoking history. He has never used smokeless tobacco. He reports that he drinks about 3.6 ounces of alcohol per week. He reports that he does not use illicit drugs.    Allergies  Allergen Reactions  . Penicillins   . Mysoline [Primidone]   . Codeine Other (See Comments)    hallucinate    Medications:  Current Outpatient Prescriptions on File Prior to Visit  Medication Sig Dispense Refill  . acetaminophen (TYLENOL) 500 MG tablet Take 500 mg by mouth every 6 (six) hours as needed.      Marland Kitchen aspirin EC 325 MG EC tablet Take 1 tablet (325 mg total) by mouth daily.  30 tablet  0  . clopidogrel (PLAVIX) 75 MG tablet Take 75 mg by mouth daily.      . fluticasone (FLONASE) 50 MCG/ACT nasal spray Place 1 spray into the nose daily.       . Folic Acid-Vit B6-Vit B12 (FOLBEE) 2.5-25-1 MG TABS Take 1 tablet by mouth daily.      . hydrOXYzine (ATARAX/VISTARIL) 10 MG tablet Take 10 mg by mouth 2 (two) times daily.      Marland Kitchen  losartan (COZAAR) 100 MG tablet Take 100 mg by mouth daily.      . propranolol (INDERAL) 20 MG tablet Take 20 mg by mouth 2 (two) times daily.       . simvastatin (ZOCOR) 10 MG tablet Take 1 tablet (10 mg total) by mouth at bedtime.  90 tablet  1  . Skin Protectants, Misc. (EUCERIN) cream Apply topically 2 (two) times daily as needed for dry skin.       No current facility-administered medications on file prior to visit.    ROS:  Out of a complete 14 system review of symptoms, the patient complains only of the following symptoms, and all other reviewed systems are negative.  Tremor Gait instability  Blood pressure 110/74, pulse 62, weight 237 lb (107.502 kg).  Physical Exam  General:  The patient is alert and cooperative at the time of the examination. The patient is moderately obese.  Skin: No significant peripheral edema is noted.   Neurologic Exam  Mental status: The patient is oriented x 3.  Cranial nerves: Facial symmetry is present. Speech is normal, no aphasia or dysarthria is noted. Extraocular movements are full. Visual fields are full. A side-to-side head tremor is noted.  Motor: The patient has good strength in all 4 extremities.  Sensory examination: Soft touch sensation on the face, arms, and legs are symmetric.  Coordination: The patient has good finger-nose-finger and heel-to-shin bilaterally. The patient has prominent tremors with finger-nose-finger bilaterally.  Gait and station: The patient has a normal gait. The patient uses a cane for ambulation. Tandem gait is minimally unsteady. Romberg is negative. No drift is seen.  Reflexes: Deep tendon reflexes are symmetric.   Assessment/Plan:  One. Benign essential tremor  2. Gait instability  The patient requests a service dog of balance, but I am not clear that this is indicated at this time. The patient is to get involved with silver sneakers or the [employer] program for balance training. I discussed the possibility of considering a deep brain stimulator for his tremor, but he does not wish to consider this at this point. I have given him a small prescription for low-dose alprazolam to use intermittently for the tremor. If this is effective, the patient will contact our office. The patient otherwise will followup in 6 months.  Marlan Palau MD 07/14/2013 7:57 PM  [facility] [address] [place], [place] [address]  Phone [phone] Fax [phone]

## 2013-07-14 NOTE — Patient Instructions (Signed)
Tremor  Tremor is a rhythmic, involuntary muscular contraction characterized by oscillations (to-and-fro movements) of a part of the body. The most common of all involuntary movements, tremor can affect various body parts such as the hands, head, facial structures, vocal cords, trunk, and legs; most tremors, however, occur in the hands. Tremor often accompanies neurological disorders associated with aging. Although the disorder is not life-threatening, it can be responsible for functional disability and social embarrassment.  TREATMENT   There are many types of tremor and several ways in which tremor is classified. The most common classification is by behavioral context or position. There are five categories of tremor within this classification: resting, postural, kinetic, task-specific, and psychogenic. Resting or static tremor occurs when the muscle is at rest, for example when the hands are lying on the lap. This type of tremor is often seen in patients with Parkinson's disease. Postural tremor occurs when a patient attempts to maintain posture, such as holding the hands outstretched. Postural tremors include physiological tremor, essential tremor, tremor with basal ganglia disease (also seen in patients with Parkinson's disease), cerebellar postural tremor, tremor with peripheral neuropathy, post-traumatic tremor, and alcoholic tremor. Kinetic or intention (action) tremor occurs during purposeful movement, for example during finger-to-nose testing. Task-specific tremor appears when performing goal-oriented tasks such as handwriting, speaking, or standing. This group consists of primary writing tremor, vocal tremor, and orthostatic tremor. Psychogenic tremor occurs in both older and younger patients. The key feature of this tremor is that it dramatically lessens or disappears when the patient is distracted.  PROGNOSIS  There are some treatment options available for tremor; the appropriate treatment depends on  accurate diagnosis of the cause. Some tremors respond to treatment of the underlying condition, for example in some cases of psychogenic tremor treating the patient's underlying mental problem may cause the tremor to disappear. Also, patients with tremor due to Parkinson's disease may be treated with Levodopa drug therapy. Symptomatic drug therapy is available for several other tremors as well. For those cases of tremor in which there is no effective drug treatment, physical measures such as teaching the patient to brace the affected limb during the tremor are sometimes useful. Surgical intervention such as thalamotomy or deep brain stimulation may be useful in certain cases.  Document Released: 08/09/2002 Document Revised: 11/11/2011 Document Reviewed: 08/19/2005  Exit[employer]® Patient Information ©2014 [employer].

## 2013-07-19 ENCOUNTER — Other Ambulatory Visit: Payer: Self-pay | Admitting: *Deleted

## 2013-07-19 DIAGNOSIS — I2510 Atherosclerotic heart disease of native coronary artery without angina pectoris: Secondary | ICD-10-CM

## 2013-07-21 ENCOUNTER — Encounter: Payer: Self-pay | Admitting: Surgery

## 2013-07-21 ENCOUNTER — Ambulatory Visit
Admission: RE | Admit: 2013-07-21 | Discharge: 2013-07-21 | Disposition: A | Discharge status: Home / Self Care | Payer: Medicare Other | Source: Ambulatory Visit | Attending: Surgery | Admitting: Surgery

## 2013-07-21 ENCOUNTER — Ambulatory Visit (INDEPENDENT_AMBULATORY_CARE_PROVIDER_SITE_OTHER): Payer: Self-pay | Admitting: Surgery

## 2013-07-21 VITALS — BP 128/86 | HR 59 | Resp 16 | Ht 69.0 in | Wt 230.0 lb

## 2013-07-21 DIAGNOSIS — I2510 Atherosclerotic heart disease of native coronary artery without angina pectoris: Secondary | ICD-10-CM

## 2013-07-21 DIAGNOSIS — Z951 Presence of aortocoronary bypass graft: Secondary | ICD-10-CM

## 2013-07-22 ENCOUNTER — Encounter: Payer: Self-pay | Admitting: Surgery

## 2013-07-22 ENCOUNTER — Encounter (HOSPITAL_COMMUNITY)
Admission: RE | Admit: 2013-07-22 | Discharge: 2013-07-22 | Disposition: A | Discharge status: Home / Self Care | Payer: Medicare Other | Source: Ambulatory Visit | Attending: Interventional Cardiology | Admitting: Interventional Cardiology

## 2013-07-22 DIAGNOSIS — Z951 Presence of aortocoronary bypass graft: Secondary | ICD-10-CM | POA: Insufficient documentation

## 2013-07-22 DIAGNOSIS — Z5189 Encounter for other specified aftercare: Secondary | ICD-10-CM | POA: Insufficient documentation

## 2013-07-22 NOTE — Progress Notes (Signed)
Cardiac Rehab Medication Review by a Pharmacist  Does the patient  feel that his/her medications are working for him/her?  yes  Has the patient been experiencing any side effects to the medications prescribed?  no  Does the patient measure his/her own blood pressure or blood glucose at home?  no   Does the patient have any problems obtaining medications due to transportation or finances?   no  Understanding of regimen: excellent Understanding of indications: excellent Potential of compliance: excellent  Dolphus Jenny 07/22/2013 8:35 AM

## 2013-07-22 NOTE — Progress Notes (Signed)
     HPI:   Patient returns for routine postoperative follow-up having undergone CABG x 5 on 06/21/2013. The patient's early postoperative recovery while in the hospital was notable for an uncomplicated postop course. Since hospital discharge the patient reports that he has been walking 5 miles per day with no chest pain or dyspnea.   Current Outpatient Prescriptions  Medication Sig Dispense Refill  . acetaminophen (TYLENOL) 500 MG tablet Take 500 mg by mouth every 8 (eight) hours.       Marland Kitchen aspirin EC 325 MG EC tablet Take 1 tablet (325 mg total) by mouth daily.  30 tablet  0  . clopidogrel (PLAVIX) 75 MG tablet Take 75 mg by mouth daily.      . fluticasone (FLONASE) 50 MCG/ACT nasal spray Place 1 spray into the nose daily.       . Folic Acid-Vit B6-Vit B12 (FOLBEE) 2.5-25-1 MG TABS Take 1 tablet by mouth daily.      . hydrOXYzine (ATARAX/VISTARIL) 10 MG tablet Take 10 mg by mouth 2 (two) times daily.      Marland Kitchen losartan (COZAAR) 100 MG tablet Take 100 mg by mouth daily.      . propranolol (INDERAL) 20 MG tablet Take 20 mg by mouth 2 (two) times daily.       . simvastatin (ZOCOR) 10 MG tablet Take 1 tablet (10 mg total) by mouth at bedtime.  90 tablet  1  . Skin Protectants, Misc. (EUCERIN) cream Apply topically 2 (two) times daily as needed for dry skin.      Marland Kitchen ALPRAZolam (XANAX) 0.5 MG tablet Take 0.5 mg by mouth at bedtime as needed for anxiety (tremors).       No current facility-administered medications for this visit.    Physical Exam: BP 128/86  Pulse 59  Resp 16  Ht 5\' 9"  (1.753 m)  Wt 230 lb (104.327 kg)  BMI 33.95 kg/m2  SpO2 98% He looks good Lungs are clear Cardiac exam shows a regular rate and rhythm with normal heart sounds The chest incision is healing well and the sternum is stable. The right leg incision is healing well and there is no peripheral edema.  Diagnostic Tests:  CLINICAL DATA: Postop heart surgery. Coronary artery disease.  EXAM:  CHEST 2 VIEW    COMPARISON: 06/22/2013  FINDINGS:  Postop CABG. Heart size is normal. Vascularity is normal. Negative  for infiltrate effusion or mass.  IMPRESSION:  No active cardiopulmonary disease.  Electronically Signed  By: Marlan Palau M.D.  On: 07/21/2013 11:01    Impression:  He is doing very well following CABG surgery. I told him he can return to driving but should not lift anything heavier than 10 lbs for 3 months postop. I encouraged him to watch his diet closely and continue exercising.  Plan:  He will continue to follow up with Dr. Eldridge Dace and will return to see me if he develops any problems with his incision. He is planning to move to [facility] in a few months and will need to establish medical care there.

## 2013-07-26 ENCOUNTER — Encounter (HOSPITAL_COMMUNITY)
Admission: RE | Admit: 2013-07-26 | Discharge: 2013-07-26 | Disposition: A | Discharge status: Home / Self Care | Payer: Medicare Other | Source: Ambulatory Visit | Attending: Interventional Cardiology | Admitting: Interventional Cardiology

## 2013-07-26 NOTE — Progress Notes (Signed)
Pt started cardiac rehab today.  Pt tolerated light exercise without difficulty. Telemetry rhythm Sinus. Patient was noted to have frequent PVC's with intermittent couplets.  Patient asymptomatic blood pressure 138/60. PVC's improved once patient exercised on the bike and nustep.  Dr Hoyle Barr office called and notified, spoke with Amy. Faxed today's ECG tracings for Dr Eldridge Dace to review. Dr Abe People reviewed today's ECG tracings. No changes made in current treatment. Will continue to monitor the patient throughout  the program.

## 2013-07-28 ENCOUNTER — Encounter (HOSPITAL_COMMUNITY)
Admission: RE | Admit: 2013-07-28 | Discharge: 2013-07-28 | Disposition: A | Discharge status: Home / Self Care | Payer: Medicare Other | Source: Ambulatory Visit | Attending: Interventional Cardiology | Admitting: Interventional Cardiology

## 2013-07-30 ENCOUNTER — Encounter (HOSPITAL_COMMUNITY): Payer: Medicare Other

## 2013-08-02 ENCOUNTER — Encounter (HOSPITAL_COMMUNITY): Payer: Medicare Other

## 2013-08-04 ENCOUNTER — Encounter (HOSPITAL_COMMUNITY): Payer: Medicare Other

## 2013-08-06 ENCOUNTER — Encounter (HOSPITAL_COMMUNITY): Payer: Medicare Other

## 2013-08-09 ENCOUNTER — Encounter (HOSPITAL_COMMUNITY): Payer: Medicare Other

## 2013-08-09 ENCOUNTER — Ambulatory Visit: Payer: Medicare Other | Admitting: Neurology

## 2013-08-10 ENCOUNTER — Encounter: Payer: Self-pay | Admitting: Interventional Cardiology

## 2013-08-11 ENCOUNTER — Encounter (HOSPITAL_COMMUNITY): Payer: Medicare Other

## 2013-08-13 ENCOUNTER — Encounter (HOSPITAL_COMMUNITY): Payer: Medicare Other

## 2013-08-16 ENCOUNTER — Encounter (HOSPITAL_COMMUNITY)
Admission: RE | Admit: 2013-08-16 | Discharge: 2013-08-16 | Disposition: A | Discharge status: Home / Self Care | Payer: Medicare Other | Source: Ambulatory Visit | Attending: Interventional Cardiology | Admitting: Interventional Cardiology

## 2013-08-16 DIAGNOSIS — Z951 Presence of aortocoronary bypass graft: Secondary | ICD-10-CM | POA: Insufficient documentation

## 2013-08-16 DIAGNOSIS — Z5189 Encounter for other specified aftercare: Secondary | ICD-10-CM | POA: Insufficient documentation

## 2013-08-16 NOTE — Progress Notes (Signed)
Reviewed home exercise with pt today.  Pt plans to walk at home and return to [facility] for exercise.  Reviewed THR, pulse, RPE, sign and symptoms, and when to call 911 or MD.  Pt voiced understanding. Fabio Pierce, MA, ACSM RCEP

## 2013-08-18 ENCOUNTER — Encounter (HOSPITAL_COMMUNITY)
Admission: RE | Admit: 2013-08-18 | Discharge: 2013-08-18 | Disposition: A | Discharge status: Home / Self Care | Payer: Medicare Other | Source: Ambulatory Visit | Attending: Interventional Cardiology | Admitting: Interventional Cardiology

## 2013-08-18 DIAGNOSIS — Z5189 Encounter for other specified aftercare: Secondary | ICD-10-CM | POA: Diagnosis not present

## 2013-08-20 ENCOUNTER — Encounter (HOSPITAL_COMMUNITY)
Admission: RE | Admit: 2013-08-20 | Discharge: 2013-08-20 | Disposition: A | Discharge status: Home / Self Care | Payer: Medicare Other | Source: Ambulatory Visit | Attending: Interventional Cardiology | Admitting: Interventional Cardiology

## 2013-08-20 DIAGNOSIS — Z5189 Encounter for other specified aftercare: Secondary | ICD-10-CM | POA: Diagnosis not present

## 2013-08-23 ENCOUNTER — Encounter (HOSPITAL_COMMUNITY)
Admission: RE | Admit: 2013-08-23 | Discharge: 2013-08-23 | Disposition: A | Discharge status: Home / Self Care | Payer: Medicare Other | Source: Ambulatory Visit | Attending: Interventional Cardiology | Admitting: Interventional Cardiology

## 2013-08-23 DIAGNOSIS — Z5189 Encounter for other specified aftercare: Secondary | ICD-10-CM | POA: Diagnosis not present

## 2013-08-23 NOTE — Progress Notes (Signed)
Dale Lynch 72 y.o. male Nutrition Note Spoke with pt.  Nutrition Plan and Nutrition Survey goals reviewed with pt. Pt is following Step 2 of the Therapeutic Lifestyle Changes diet. Pt wants to lose wt. Pt wt is down 0.6 kg (1.3 lb) over the past month. Pt has been trying to lose wt by "watching how much I eat and walking more." Pt reports he walks 4 miles/d on the days he is not in rehab. Wt loss tips reviewed. Pt is pre-diabetic according to pt's most recent A1c. Pre-diabetes discussed. Pt expressed understanding of the information reviewed. Pt aware of nutrition education classes offered.  Nutrition Diagnosis   Food-and nutrition-related knowledge deficit related to lack of exposure to information as related to diagnosis of: ? CVD ? Pre-DM (A1c 5.7) ?   Obesity related to excessive energy intake as evidenced by a BMI of 35.5    Nutrition RX/ Estimated Daily Nutrition Needs for: wt loss  2000-2500 Kcal, 55-65 gm fat, 13-17 gm sat fat, 2.0-2.5 gm trans-fat, <1500 mg sodium   Nutrition Intervention   Pt's individual nutrition plan including cholesterol goals reviewed with pt.   Benefits of adopting Therapeutic Lifestyle Changes discussed when Medficts reviewed.   Pt to attend the Portion Distortion class   Pt to attend the  ? Nutrition I class                     ? Nutrition II class   Pt given handouts for: ? Nutrition I class ? Nutrition II class ? Pre-diabetes    Continue client-centered nutrition education by RD, as part of interdisciplinary care.  Goal(s)   Pt to identify food quantities necessary to achieve: ? wt loss to a goal wt of 211-229 lb (95.9-104.1 kg) at graduation from cardiac rehab.   Monitor and Evaluate progress toward nutrition goal with team. Nutrition Risk:  Low   Mickle Plumb, M.Ed, RD, LDN, CDE 08/23/2013 10:40 AM

## 2013-08-25 ENCOUNTER — Encounter (HOSPITAL_COMMUNITY)
Admission: RE | Admit: 2013-08-25 | Discharge: 2013-08-25 | Disposition: A | Discharge status: Home / Self Care | Payer: Medicare Other | Source: Ambulatory Visit | Attending: Interventional Cardiology | Admitting: Interventional Cardiology

## 2013-08-25 DIAGNOSIS — Z5189 Encounter for other specified aftercare: Secondary | ICD-10-CM | POA: Diagnosis not present

## 2013-08-27 ENCOUNTER — Encounter (HOSPITAL_COMMUNITY): Payer: Medicare Other

## 2013-08-30 ENCOUNTER — Encounter (HOSPITAL_COMMUNITY): Payer: Medicare Other

## 2013-09-01 ENCOUNTER — Encounter (HOSPITAL_COMMUNITY)
Admission: RE | Admit: 2013-09-01 | Discharge: 2013-09-01 | Disposition: A | Discharge status: Home / Self Care | Payer: Medicare Other | Source: Ambulatory Visit | Attending: Interventional Cardiology | Admitting: Interventional Cardiology

## 2013-09-01 DIAGNOSIS — Z5189 Encounter for other specified aftercare: Secondary | ICD-10-CM | POA: Diagnosis not present

## 2013-09-03 ENCOUNTER — Encounter (HOSPITAL_COMMUNITY): Payer: Medicare Other

## 2013-09-06 ENCOUNTER — Encounter (HOSPITAL_COMMUNITY)
Admission: RE | Admit: 2013-09-06 | Discharge: 2013-09-06 | Disposition: A | Discharge status: Home / Self Care | Payer: Medicare Other | Source: Ambulatory Visit | Attending: Interventional Cardiology | Admitting: Interventional Cardiology

## 2013-09-06 DIAGNOSIS — Z5189 Encounter for other specified aftercare: Secondary | ICD-10-CM | POA: Insufficient documentation

## 2013-09-06 DIAGNOSIS — Z951 Presence of aortocoronary bypass graft: Secondary | ICD-10-CM | POA: Insufficient documentation

## 2013-09-07 ENCOUNTER — Encounter: Payer: Self-pay | Admitting: Podiatry

## 2013-09-07 ENCOUNTER — Ambulatory Visit (INDEPENDENT_AMBULATORY_CARE_PROVIDER_SITE_OTHER): Payer: Medicare Other | Admitting: Podiatry

## 2013-09-07 VITALS — BP 132/78 | HR 57 | Resp 16

## 2013-09-07 DIAGNOSIS — M79609 Pain in unspecified limb: Secondary | ICD-10-CM

## 2013-09-07 DIAGNOSIS — B351 Tinea unguium: Secondary | ICD-10-CM

## 2013-09-07 NOTE — Progress Notes (Signed)
   Subjective:    Patient ID: Dale Lynch, male    DOB: 11/23/1940, 73 y.o.   MRN: [id number]  HPI Comments: Routine nail debridement      Review of Systems     Objective:   Physical Exam: Vital signs are stable he is alert and oriented x3. Pulses are palpable bilateral. Nails are thick yellow dystrophic onychomycotic and severely elongated.        Assessment & Plan:  Assessment: Pain in limb secondary to onychomycosis 1 through 5 bilateral.  Plan: Debridement of nails 1 through 5 bilateral is cover service secondary to pain. Followup with him in 3 months

## 2013-09-08 ENCOUNTER — Encounter (HOSPITAL_COMMUNITY): Payer: Medicare Other

## 2013-09-10 ENCOUNTER — Encounter (HOSPITAL_COMMUNITY): Payer: Medicare Other

## 2013-09-13 ENCOUNTER — Encounter (HOSPITAL_COMMUNITY)
Admission: RE | Admit: 2013-09-13 | Discharge: 2013-09-13 | Disposition: A | Discharge status: Home / Self Care | Payer: Medicare Other | Source: Ambulatory Visit | Attending: Interventional Cardiology | Admitting: Interventional Cardiology

## 2013-09-15 ENCOUNTER — Telehealth (HOSPITAL_COMMUNITY): Payer: Self-pay | Admitting: Family Medicine

## 2013-09-15 ENCOUNTER — Encounter (HOSPITAL_COMMUNITY): Payer: Medicare Other

## 2013-09-17 ENCOUNTER — Encounter (HOSPITAL_COMMUNITY): Payer: Medicare Other

## 2013-09-20 ENCOUNTER — Encounter (HOSPITAL_COMMUNITY): Payer: Medicare Other

## 2013-09-22 ENCOUNTER — Encounter (HOSPITAL_COMMUNITY)
Admission: RE | Admit: 2013-09-22 | Discharge: 2013-09-22 | Disposition: A | Discharge status: Home / Self Care | Payer: Medicare Other | Source: Ambulatory Visit | Attending: Interventional Cardiology | Admitting: Interventional Cardiology

## 2013-09-24 ENCOUNTER — Encounter (HOSPITAL_COMMUNITY)
Admission: RE | Admit: 2013-09-24 | Discharge: 2013-09-24 | Disposition: A | Discharge status: Home / Self Care | Payer: Medicare Other | Source: Ambulatory Visit | Attending: Interventional Cardiology | Admitting: Interventional Cardiology

## 2013-09-27 ENCOUNTER — Encounter (HOSPITAL_COMMUNITY)
Admission: RE | Admit: 2013-09-27 | Discharge: 2013-09-27 | Disposition: A | Discharge status: Home / Self Care | Payer: Medicare Other | Source: Ambulatory Visit | Attending: Interventional Cardiology | Admitting: Interventional Cardiology

## 2013-09-29 ENCOUNTER — Encounter (HOSPITAL_COMMUNITY)
Admission: RE | Admit: 2013-09-29 | Discharge: 2013-09-29 | Disposition: A | Discharge status: Home / Self Care | Payer: Medicare Other | Source: Ambulatory Visit | Attending: Interventional Cardiology | Admitting: Interventional Cardiology

## 2013-10-01 ENCOUNTER — Encounter (HOSPITAL_COMMUNITY)
Admission: RE | Admit: 2013-10-01 | Discharge: 2013-10-01 | Disposition: A | Discharge status: Home / Self Care | Payer: Medicare Other | Source: Ambulatory Visit | Attending: Interventional Cardiology | Admitting: Interventional Cardiology

## 2013-10-04 ENCOUNTER — Encounter (HOSPITAL_COMMUNITY)
Admission: RE | Admit: 2013-10-04 | Discharge: 2013-10-04 | Disposition: A | Discharge status: Home / Self Care | Payer: Medicare Other | Source: Ambulatory Visit | Attending: Interventional Cardiology | Admitting: Interventional Cardiology

## 2013-10-04 DIAGNOSIS — Z5189 Encounter for other specified aftercare: Secondary | ICD-10-CM | POA: Insufficient documentation

## 2013-10-04 DIAGNOSIS — Z951 Presence of aortocoronary bypass graft: Secondary | ICD-10-CM | POA: Insufficient documentation

## 2013-10-06 ENCOUNTER — Encounter (HOSPITAL_COMMUNITY): Payer: Medicare Other

## 2013-10-08 ENCOUNTER — Encounter (HOSPITAL_COMMUNITY)
Admission: RE | Admit: 2013-10-08 | Discharge: 2013-10-08 | Disposition: A | Discharge status: Home / Self Care | Payer: Medicare Other | Source: Ambulatory Visit | Attending: Interventional Cardiology | Admitting: Interventional Cardiology

## 2013-10-11 ENCOUNTER — Encounter (HOSPITAL_COMMUNITY)
Admission: RE | Admit: 2013-10-11 | Discharge: 2013-10-11 | Disposition: A | Discharge status: Home / Self Care | Payer: Medicare Other | Source: Ambulatory Visit | Attending: Interventional Cardiology | Admitting: Interventional Cardiology

## 2013-10-13 ENCOUNTER — Encounter (HOSPITAL_COMMUNITY): Admission: RE | Admit: 2013-10-13 | Payer: Medicare Other | Source: Ambulatory Visit

## 2013-10-15 ENCOUNTER — Encounter (HOSPITAL_COMMUNITY)
Admission: RE | Admit: 2013-10-15 | Discharge: 2013-10-15 | Disposition: A | Discharge status: Home / Self Care | Payer: Medicare Other | Source: Ambulatory Visit | Attending: Interventional Cardiology | Admitting: Interventional Cardiology

## 2013-10-18 ENCOUNTER — Ambulatory Visit: Payer: Medicare Other | Admitting: Interventional Cardiology

## 2013-10-18 ENCOUNTER — Other Ambulatory Visit: Payer: Self-pay | Admitting: Neurology

## 2013-10-18 ENCOUNTER — Encounter (HOSPITAL_COMMUNITY): Payer: Medicare Other

## 2013-10-18 NOTE — Telephone Encounter (Signed)
We have been prescribing this med since 2011 per [facility] records  

## 2013-10-20 ENCOUNTER — Telehealth (HOSPITAL_COMMUNITY): Payer: Self-pay | Admitting: Family Medicine

## 2013-10-20 ENCOUNTER — Encounter (HOSPITAL_COMMUNITY): Payer: Medicare Other

## 2013-10-22 ENCOUNTER — Telehealth (HOSPITAL_COMMUNITY): Payer: Self-pay | Admitting: Family Medicine

## 2013-10-22 ENCOUNTER — Encounter (HOSPITAL_COMMUNITY): Admission: RE | Admit: 2013-10-22 | Payer: Medicare Other | Source: Ambulatory Visit

## 2013-10-22 ENCOUNTER — Telehealth (HOSPITAL_COMMUNITY): Payer: Self-pay | Admitting: *Deleted

## 2013-10-25 ENCOUNTER — Encounter (HOSPITAL_COMMUNITY)
Admission: RE | Admit: 2013-10-25 | Discharge: 2013-10-25 | Disposition: A | Discharge status: Home / Self Care | Payer: Medicare Other | Source: Ambulatory Visit | Attending: Interventional Cardiology | Admitting: Interventional Cardiology

## 2013-10-27 ENCOUNTER — Encounter: Payer: Self-pay | Admitting: Interventional Cardiology

## 2013-10-27 ENCOUNTER — Ambulatory Visit (INDEPENDENT_AMBULATORY_CARE_PROVIDER_SITE_OTHER): Payer: Medicare Other | Admitting: Interventional Cardiology

## 2013-10-27 ENCOUNTER — Encounter (HOSPITAL_COMMUNITY): Payer: Medicare Other

## 2013-10-27 VITALS — BP 152/84 | HR 78 | Ht 69.0 in | Wt 235.0 lb

## 2013-10-27 DIAGNOSIS — E785 Hyperlipidemia, unspecified: Secondary | ICD-10-CM

## 2013-10-27 DIAGNOSIS — I679 Cerebrovascular disease, unspecified: Secondary | ICD-10-CM

## 2013-10-27 DIAGNOSIS — I2510 Atherosclerotic heart disease of native coronary artery without angina pectoris: Secondary | ICD-10-CM

## 2013-10-27 DIAGNOSIS — I10 Essential (primary) hypertension: Secondary | ICD-10-CM

## 2013-10-27 MED ORDER — SIMVASTATIN 40 MG PO TABS: ORAL_TABLET | ORAL | Status: AC

## 2013-10-27 NOTE — Patient Instructions (Signed)
Your physician has recommended you make the following change in your medication:   1. Stop Aspirin 325 mg.   Your physician wants you to follow-up in: 6 months with Dr. Eldridge DaceVaranasi. You will receive a reminder letter in the mail two months in advance. If you don't receive a letter, please call our office to schedule the follow-up appointment.

## 2013-10-27 NOTE — Progress Notes (Signed)
Patient ID: Dale Lynch, male   DOB: 08/08/1941, 73 y.o.   MRN: [id number] Patient ID: Dale Lynch, male   DOB: 07/22/1941, 73 y.o.   MRN: [id number]    [address] [place], [place]  [address] Phone: [phone]) [phone] Fax:  [phone]) [phone]  Date:  10/27/2013   ID:  Dale Lynch, DOB 04/07/1941, MRN [id number]  PCP:  Darrow BussingKOIRALA,DIBAS, MD      History of Present Illness: Dale Lynch is a 73 y.o. male who had angina prompting cath.  He had 3 vessel disease and had CABG in 10/14.  he has done very well since the surgery. He has lost some weight. He is back to walking 3 miles a day over the course of 2 sessions.  [facility].  No problems there.  He does not have any of the angina that he had prior to his procedure. His chest discomfort from the surgery has improved greatly. He had some leg pain at the vein harvest site but this is also improved. Overall, he feels back to normal.  He tells me that he and his wife are planning on moving to [place] in 2015.   BP at rehab has been up to 150/80 range.  Typically, it is in the 114-128 / 70s range. BP drops after exercise.   Wt Readings from Last 3 Encounters:  10/27/13 235 lb (106.595 kg)  07/22/13 235 lb 7.2 oz (106.8 kg)  07/21/13 230 lb (104.327 kg)     Past Medical History  Diagnosis Date  . Gait disorder   . Hypertension   . Obese   . Dyslipidemia   . Cerebrovascular disease     Left vertebral artery occlusion  . Other and unspecified angina pectoris   . AAA (abdominal aortic aneurysm) 1996  . Brainstem stroke 2004    "one of my vertebral arteries occluded" ,denies residual on 06/16/2013  . Arthritis     "joints" (06/16/2013)  . Nephrolithiasis 1977    "passed on their own" (06/16/2013)  . Essential tremor     Current Outpatient Prescriptions  Medication Sig Dispense Refill  . aspirin EC 325 MG EC tablet Take 1 tablet (325 mg total) by mouth daily.  30 tablet  0  . clopidogrel (PLAVIX) 75 MG tablet TAKE 1  TABLET EVERY DAY  90 tablet  3  . fluticasone (FLONASE) 50 MCG/ACT nasal spray Place 1 spray into the nose daily.       . Folic Acid-Vit B6-Vit B12 (FOLBEE) 2.5-25-1 MG TABS Take 1 tablet by mouth daily.      . hydrOXYzine (ATARAX/VISTARIL) 10 MG tablet Take 10 mg by mouth 2 (two) times daily.      Marland Kitchen. losartan (COZAAR) 100 MG tablet Take 100 mg by mouth daily.      . propranolol (INDERAL) 20 MG tablet Take 20 mg by mouth 2 (two) times daily.       . simvastatin (ZOCOR) 10 MG tablet Take 1 tablet (10 mg total) by mouth at bedtime.  90 tablet  1  . Skin Protectants, Misc. (EUCERIN) cream Apply topically 2 (two) times daily as needed for dry skin.       No current facility-administered medications for this visit.    Allergies:    Allergies  Allergen Reactions  . Penicillins Anaphylaxis and Swelling  . Mysoline [Primidone]     Double vision  . Codeine Other (See Comments)    hallucinate  Social History:  The patient  reports that he has quit smoking. His smoking use included Cigarettes. He has a 15 pack-year smoking history. He has never used smokeless tobacco. He reports that he drinks about 3.6 ounces of alcohol per week. He reports that he does not use illicit drugs.   Family History:  The patient's family history includes Cancer in his mother; Lung cancer in his father; Tremor in his brother and mother.   ROS:  Please see the history of present illness.  No nausea, vomiting.  No fevers, chills.  No focal weakness.  No dysuria.    All other systems reviewed and negative.   PHYSICAL EXAM: VS:  BP 152/84  Pulse 78  Ht 5\' 9"  (1.753 m)  Wt 235 lb (106.595 kg)  BMI 34.69 kg/m2 Well nourished, well developed, in no acute distress HEENT: normal Neck: no JVD, no carotid bruits Cardiac:  normal S1, S2; RRR; sternal incision healing very well Lungs:  clear to auscultation bilaterally, no wheezing, rhonchi or rales Abd: soft, nontender, no hepatomegaly Ext: no edema, small mobile density  in the right elbow/forearm areat Skin: warm and dry Neuro:   no focal abnormalities noted     ASSESSMENT AND PLAN:  1. CAD: Angina resolved post CABG.  Continue Plavix.  Avoid Celebrex.  He will use Tylenol for pain if needed. Continue aggressive secondary prevention. He is committed to trying to lose weight and change his lifestyle.  I recommended continue cardiac rehabilitation. He is finding it harder to get his HR up with more exercise.    2. Hyperlipidemia: Refill simvastatin.  LDL target less than 100.  Last LDL 107 in 1/15.  Simvastatin increased to 40 mg daily by PCP.   3. HTN: Check BP at home or at the drug store. Blood pressure will improve with continued weight loss and diet control.  I will see him back in about 3 months. We'll reassess lipids at that time.  4. He was on PLavix since his stroke in 2004.  ASA was added at the time of the CABG.  Given increased bleeding risk with aspirin, will stop aspirin and continue plavix.   5.   In regards to the cyst like structure in his arm.  It is freely mobile.  Watch to see if it gets bigger.  I suspect it is a cyst but it could be evaluated by u/s if needed.  He will f/u with his PCP.  Signed, Fredric Mare, MD, [facility] 10/27/2013 12:11 PM

## 2013-10-29 ENCOUNTER — Encounter (HOSPITAL_COMMUNITY): Payer: Medicare Other

## 2013-11-01 ENCOUNTER — Encounter (HOSPITAL_COMMUNITY): Payer: Medicare Other

## 2013-11-03 ENCOUNTER — Encounter (HOSPITAL_COMMUNITY): Payer: Medicare Other

## 2013-11-05 ENCOUNTER — Encounter (HOSPITAL_COMMUNITY): Payer: Medicare Other

## 2013-11-08 ENCOUNTER — Encounter (HOSPITAL_COMMUNITY): Payer: Medicare Other

## 2013-11-10 ENCOUNTER — Encounter (HOSPITAL_COMMUNITY)
Admission: RE | Admit: 2013-11-10 | Discharge: 2013-11-10 | Disposition: A | Discharge status: Home / Self Care | Payer: Medicare Other | Source: Ambulatory Visit | Attending: Interventional Cardiology | Admitting: Interventional Cardiology

## 2013-11-10 DIAGNOSIS — Z5189 Encounter for other specified aftercare: Secondary | ICD-10-CM | POA: Insufficient documentation

## 2013-11-10 DIAGNOSIS — Z951 Presence of aortocoronary bypass graft: Secondary | ICD-10-CM | POA: Insufficient documentation

## 2013-11-12 ENCOUNTER — Encounter (HOSPITAL_COMMUNITY)
Admission: RE | Admit: 2013-11-12 | Discharge: 2013-11-12 | Disposition: A | Discharge status: Home / Self Care | Payer: Medicare Other | Source: Ambulatory Visit | Attending: Interventional Cardiology | Admitting: Interventional Cardiology

## 2013-11-15 ENCOUNTER — Telehealth (HOSPITAL_COMMUNITY): Payer: Self-pay | Admitting: Family Medicine

## 2013-11-15 ENCOUNTER — Encounter (HOSPITAL_COMMUNITY): Payer: Medicare Other

## 2013-11-17 ENCOUNTER — Encounter (HOSPITAL_COMMUNITY): Payer: Medicare Other

## 2013-11-19 ENCOUNTER — Encounter (HOSPITAL_COMMUNITY)
Admission: RE | Admit: 2013-11-19 | Discharge: 2013-11-19 | Disposition: A | Discharge status: Home / Self Care | Payer: Medicare Other | Source: Ambulatory Visit | Attending: Interventional Cardiology | Admitting: Interventional Cardiology

## 2013-11-22 ENCOUNTER — Encounter (HOSPITAL_COMMUNITY): Payer: Medicare Other

## 2013-11-24 ENCOUNTER — Encounter (HOSPITAL_COMMUNITY): Payer: Medicare Other

## 2013-11-26 ENCOUNTER — Encounter (HOSPITAL_COMMUNITY)
Admission: RE | Admit: 2013-11-26 | Discharge: 2013-11-26 | Disposition: A | Discharge status: Home / Self Care | Payer: Medicare Other | Source: Ambulatory Visit | Attending: Interventional Cardiology | Admitting: Interventional Cardiology

## 2013-11-26 NOTE — Progress Notes (Signed)
Dale Lynch graduates today. He plans to continue exercise on his own.

## 2013-11-30 ENCOUNTER — Ambulatory Visit (INDEPENDENT_AMBULATORY_CARE_PROVIDER_SITE_OTHER): Payer: Medicare Other | Admitting: Podiatry

## 2013-11-30 ENCOUNTER — Encounter: Payer: Self-pay | Admitting: Podiatry

## 2013-11-30 VITALS — BP 141/87 | HR 61 | Resp 16

## 2013-11-30 DIAGNOSIS — B351 Tinea unguium: Secondary | ICD-10-CM

## 2013-11-30 DIAGNOSIS — M79609 Pain in unspecified limb: Secondary | ICD-10-CM

## 2013-11-30 NOTE — Progress Notes (Signed)
He presents today with a chief complaint of painful elongated toenails one through 5 bilateral.  Objective: Vital signs are stable he is alert and oriented x3. Pulses are palpable bilateral. Nails are thick yellow dystrophic with mycotic and painful on palpation.  Assessment: Pain in limb secondary to onychomycosis 1 through 5 bilateral.  Plan: Debridement of nails 1 through 5 bilateral is cover service secondary to pain.

## 2013-12-17 ENCOUNTER — Encounter: Payer: Self-pay | Admitting: Interventional Cardiology

## 2014-01-11 ENCOUNTER — Ambulatory Visit (INDEPENDENT_AMBULATORY_CARE_PROVIDER_SITE_OTHER): Payer: Medicare Other | Admitting: Neurology

## 2014-01-11 ENCOUNTER — Encounter: Payer: Self-pay | Admitting: Neurology

## 2014-01-11 VITALS — BP 134/83 | HR 51 | Wt 240.0 lb

## 2014-01-11 DIAGNOSIS — G250 Essential tremor: Secondary | ICD-10-CM

## 2014-01-11 DIAGNOSIS — I2510 Atherosclerotic heart disease of native coronary artery without angina pectoris: Secondary | ICD-10-CM

## 2014-01-11 DIAGNOSIS — G252 Other specified forms of tremor: Secondary | ICD-10-CM

## 2014-01-11 MED ORDER — PROPRANOLOL HCL 20 MG PO TABS: 20.0000 mg | ORAL_TABLET | Freq: Two times a day (BID) | ORAL | Status: AC

## 2014-01-11 MED ORDER — CLOPIDOGREL BISULFATE 75 MG PO TABS: ORAL_TABLET | ORAL | Status: AC

## 2014-01-11 NOTE — Progress Notes (Signed)
PATIENT: Dale Lynch DOB: 11/19/1940  REASON FOR VISIT: follow up HISTORY FROM: patient  HISTORY OF PRESENT ILLNESS: [name] is a 73 year old right-handed white male with a history of a benign essential tremor. He returns for follow-up. The patient currently takes propranolol and states that this medication has worked the best. At the last visit alprazolam was given but the patient states that this medication was too sedating. He no longer takes this medication. The tremor affects both arms and has remained about the same since the last visit. The patient states that the tremor is more pronounced when he is fatigued or stressed. He denies having to give up any activities due to the tremor. No new medical issues since the last visit. The patient is relocating to [place] and will need to find a neurologist to take over his care there.    REVIEW OF SYSTEMS: Full 14 system review of systems performed and notable only for:  Constitutional: N/A  Eyes: N/A Ear/Nose/Throat: N/A  Skin: N/A  Cardiovascular: N/A  Respiratory: N/A  Gastrointestinal: N/A  Genitourinary: N/A Hematology/Lymphatic: bruise/bleed easily Endocrine: N/A Musculoskeletal:muscle cramps Allergy/Immunology: N/A  Neurological: tremors Psychiatric: N/A Sleep: N/A   ALLERGIES: Allergies  Allergen Reactions  . Penicillins Anaphylaxis and Swelling  . Mysoline [Primidone]     Double vision  . Codeine Other (See Comments)    hallucinate    HOME MEDICATIONS: Outpatient Prescriptions Prior to Visit  Medication Sig Dispense Refill  . clopidogrel (PLAVIX) 75 MG tablet TAKE 1 TABLET EVERY DAY  90 tablet  3  . fluticasone (FLONASE) 50 MCG/ACT nasal spray Place 1 spray into the nose daily.       . Folic Acid-Vit B6-Vit B12 (FOLBEE) 2.5-25-1 MG TABS Take 1 tablet by mouth daily.      . hydrOXYzine (ATARAX/VISTARIL) 10 MG tablet Take 10 mg by mouth 2 (two) times daily.      Marland Kitchen. losartan (COZAAR) 100 MG tablet Take 100 mg  by mouth daily.      . propranolol (INDERAL) 20 MG tablet Take 20 mg by mouth 2 (two) times daily.       . simvastatin (ZOCOR) 40 MG tablet 1 tablet by mouth daily  90 tablet  1  . Skin Protectants, Misc. (EUCERIN) cream Apply topically 2 (two) times daily as needed for dry skin.      Marland Kitchen. triamcinolone cream (KENALOG) 0.1 %        No facility-administered medications prior to visit.    PAST MEDICAL HISTORY: Past Medical History  Diagnosis Date  . Gait disorder   . Hypertension   . Obese   . Dyslipidemia   . Cerebrovascular disease     Left vertebral artery occlusion  . Other and unspecified angina pectoris   . AAA (abdominal aortic aneurysm) 1996  . Brainstem stroke 2004    "one of my vertebral arteries occluded" ,denies residual on 06/16/2013  . Arthritis     "joints" (06/16/2013)  . Nephrolithiasis 1977    "passed on their own" (06/16/2013)  . Essential tremor     PAST SURGICAL HISTORY: Past Surgical History  Procedure Laterality Date  . Abdominal aortic aneurysm repair  1996  . Coronary angioplasty with stent placement  06/16/2013  . Cholecystectomy  1996  . Hernia repair  1996    "belly button" (06/16/2013)  . Coronary artery bypass graft N/A 06/21/2013    Procedure: CORONARY ARTERY BYPASS GRAFTING (CABG);  Surgeon: Alleen BorneBryan K Bartle, MD;  [facility];  Service: Open Heart Surgery;  Laterality: N/A;  . Intraoperative transesophageal echocardiogram N/A 06/21/2013    Procedure: INTRAOPERATIVE TRANSESOPHAGEAL ECHOCARDIOGRAM;  Surgeon: Alleen Borne, MD;  Location[facility] OR;  Service: Open Heart Surgery;  Laterality: N/A;    FAMILY HISTORY: Family History  Problem Relation Age of Onset  . Cancer Mother   . Tremor Mother   . Lung cancer Father   . Tremor Brother     SOCIAL HISTORY: History   Social History  . Marital Status: Married    Spouse Name: N/A    Number of Children: 1  . Years of Education: COLLEGEM   Occupational History  .      Dog handler &  Mediator   Social History Main Topics  . Smoking status: Former Smoker -- 1.00 packs/day for 15 years    Types: Cigarettes  . Smokeless tobacco: Never Used     Comment: 06/16/2013 "quit smoking ~ 1977"  . Alcohol Use: 3.6 oz/week    6 Glasses of wine per week     Comment: 06/16/2013 '1 bottle of wine/wk"  . Drug Use: No  . Sexual Activity: Yes   Other Topics Concern  . Not on file   Social History Narrative  . No narrative on file      PHYSICAL EXAM  Filed Vitals:   01/11/14 0850  BP: 134/83  Pulse: 51  Weight: 240 lb (108.863 kg)   Body mass index is 35.43 kg/(m^2).  Generalized: Well developed, in no acute distress   Neurological examination  Mentation: Alert oriented to time, place, history taking. Follows all commands speech and language fluent Cranial nerve II-XII:  Extraocular movements were full, visual field were full on confrontational test.  Motor: The motor testing reveals 5 over 5 strength of all 4 extremities. Good symmetric motor tone is noted throughout.  Sensory: Sensory testing is intact to soft touch on all 4 extremities. No evidence of extinction is noted.  Coordination: Cerebellar testing reveals good finger-nose-finger and heel-to-shin bilaterally. Intention tremor noted in both hands Gait and station: Gait is normal. Tandem gait is normal. Romberg is negative. No drift is seen.  Reflexes: Deep tendon reflexes are symmetric and normal bilaterally.   DIAGNOSTIC DATA (LABS, IMAGING, TESTING) - I reviewed patient records, labs, notes, testing and imaging myself where available.  Lab Results  Component Value Date   WBC 14.6* 06/24/2013   HGB 12.6* 06/24/2013   HCT 36.4* 06/24/2013   MCV 93.1 06/24/2013   PLT 170 06/24/2013      Component Value Date/Time   NA 134* 06/23/2013 0505   K 4.0 06/23/2013 0505   CL 98 06/23/2013 0505   CO2 26 06/23/2013 0505   GLUCOSE 123* 06/23/2013 0505   BUN 12 06/23/2013 0505   CREATININE 0.83 06/23/2013 0505    CALCIUM 8.4 06/23/2013 0505   PROT 7.4 06/21/2013 0545   ALBUMIN 4.3 06/21/2013 0545   AST 73* 06/21/2013 0545   ALT 100* 06/21/2013 0545   ALKPHOS 82 06/21/2013 0545   BILITOT 0.8 06/21/2013 0545   GFRNONAA 87* 06/23/2013 0505   GFRAA >90 06/23/2013 0505    Lab Results  Component Value Date   HGBA1C 5.7* 06/20/2013     ASSESSMENT AND PLAN 73 y.o. year old male  has a past medical history of Gait disorder; Hypertension; Obese; Dyslipidemia; Cerebrovascular disease; Other and unspecified angina pectoris; AAA (abdominal aortic aneurysm) (1996); Brainstem stroke (2004); Arthritis; Nephrolithiasis (1977); and Essential tremor. here with  1. Essential tremor  The patient's tremor has remained stable. He was unable to tolerate the alprazolam.  He is to continue propanolol and Plavix, will refill today The patient is moving to [place], once he finds a neurologist he wants to see, we will make a referral.   Butch PennyMegan Lailyn Appelbaum, MSN, NP-C 01/11/2014, 9:17 AM [facility] [address], [address] [place], [place] [address] [phone]  Note: This document was prepared with digital dictation and possible smart phrase technology. Any transcriptional errors that result from this process are unintentional. York Spanielharles K Willis

## 2014-01-11 NOTE — Patient Instructions (Signed)
Tremor  Tremor is a rhythmic, involuntary muscular contraction characterized by oscillations (to-and-fro movements) of a part of the body. The most common of all involuntary movements, tremor can affect various body parts such as the hands, head, facial structures, vocal cords, trunk, and legs; most tremors, however, occur in the hands. Tremor often accompanies neurological disorders associated with aging. Although the disorder is not life-threatening, it can be responsible for functional disability and social embarrassment.  TREATMENT   There are many types of tremor and several ways in which tremor is classified. The most common classification is by behavioral context or position. There are five categories of tremor within this classification: resting, postural, kinetic, task-specific, and psychogenic. Resting or static tremor occurs when the muscle is at rest, for example when the hands are lying on the lap. This type of tremor is often seen in patients with Parkinson's disease. Postural tremor occurs when a patient attempts to maintain posture, such as holding the hands outstretched. Postural tremors include physiological tremor, essential tremor, tremor with basal ganglia disease (also seen in patients with Parkinson's disease), cerebellar postural tremor, tremor with peripheral neuropathy, post-traumatic tremor, and alcoholic tremor. Kinetic or intention (action) tremor occurs during purposeful movement, for example during finger-to-nose testing. Task-specific tremor appears when performing goal-oriented tasks such as handwriting, speaking, or standing. This group consists of primary writing tremor, vocal tremor, and orthostatic tremor. Psychogenic tremor occurs in both older and younger patients. The key feature of this tremor is that it dramatically lessens or disappears when the patient is distracted.  PROGNOSIS  There are some treatment options available for tremor; the appropriate treatment depends on  accurate diagnosis of the cause. Some tremors respond to treatment of the underlying condition, for example in some cases of psychogenic tremor treating the patient's underlying mental problem may cause the tremor to disappear. Also, patients with tremor due to Parkinson's disease may be treated with Levodopa drug therapy. Symptomatic drug therapy is available for several other tremors as well. For those cases of tremor in which there is no effective drug treatment, physical measures such as teaching the patient to brace the affected limb during the tremor are sometimes useful. Surgical intervention such as thalamotomy or deep brain stimulation may be useful in certain cases.  Document Released: 08/09/2002 Document Revised: 11/11/2011 Document Reviewed: 08/19/2005  Exit[employer]® Patient Information ©2014 [employer].

## 2014-03-01 ENCOUNTER — Ambulatory Visit: Payer: Medicare Other | Admitting: Podiatry

## 2014-08-11 ENCOUNTER — Encounter (HOSPITAL_COMMUNITY): Payer: Self-pay | Admitting: Interventional Cardiology

## 2015-10-14 IMAGING — CR DG CHEST 2V
2 series · 2 of 2 positions shown · non-contrast
Comparison: 06/22/2013

CLINICAL DATA: Postop heart surgery.  Coronary artery disease.

EXAM:
CHEST  2 VIEW

[w chest pa]
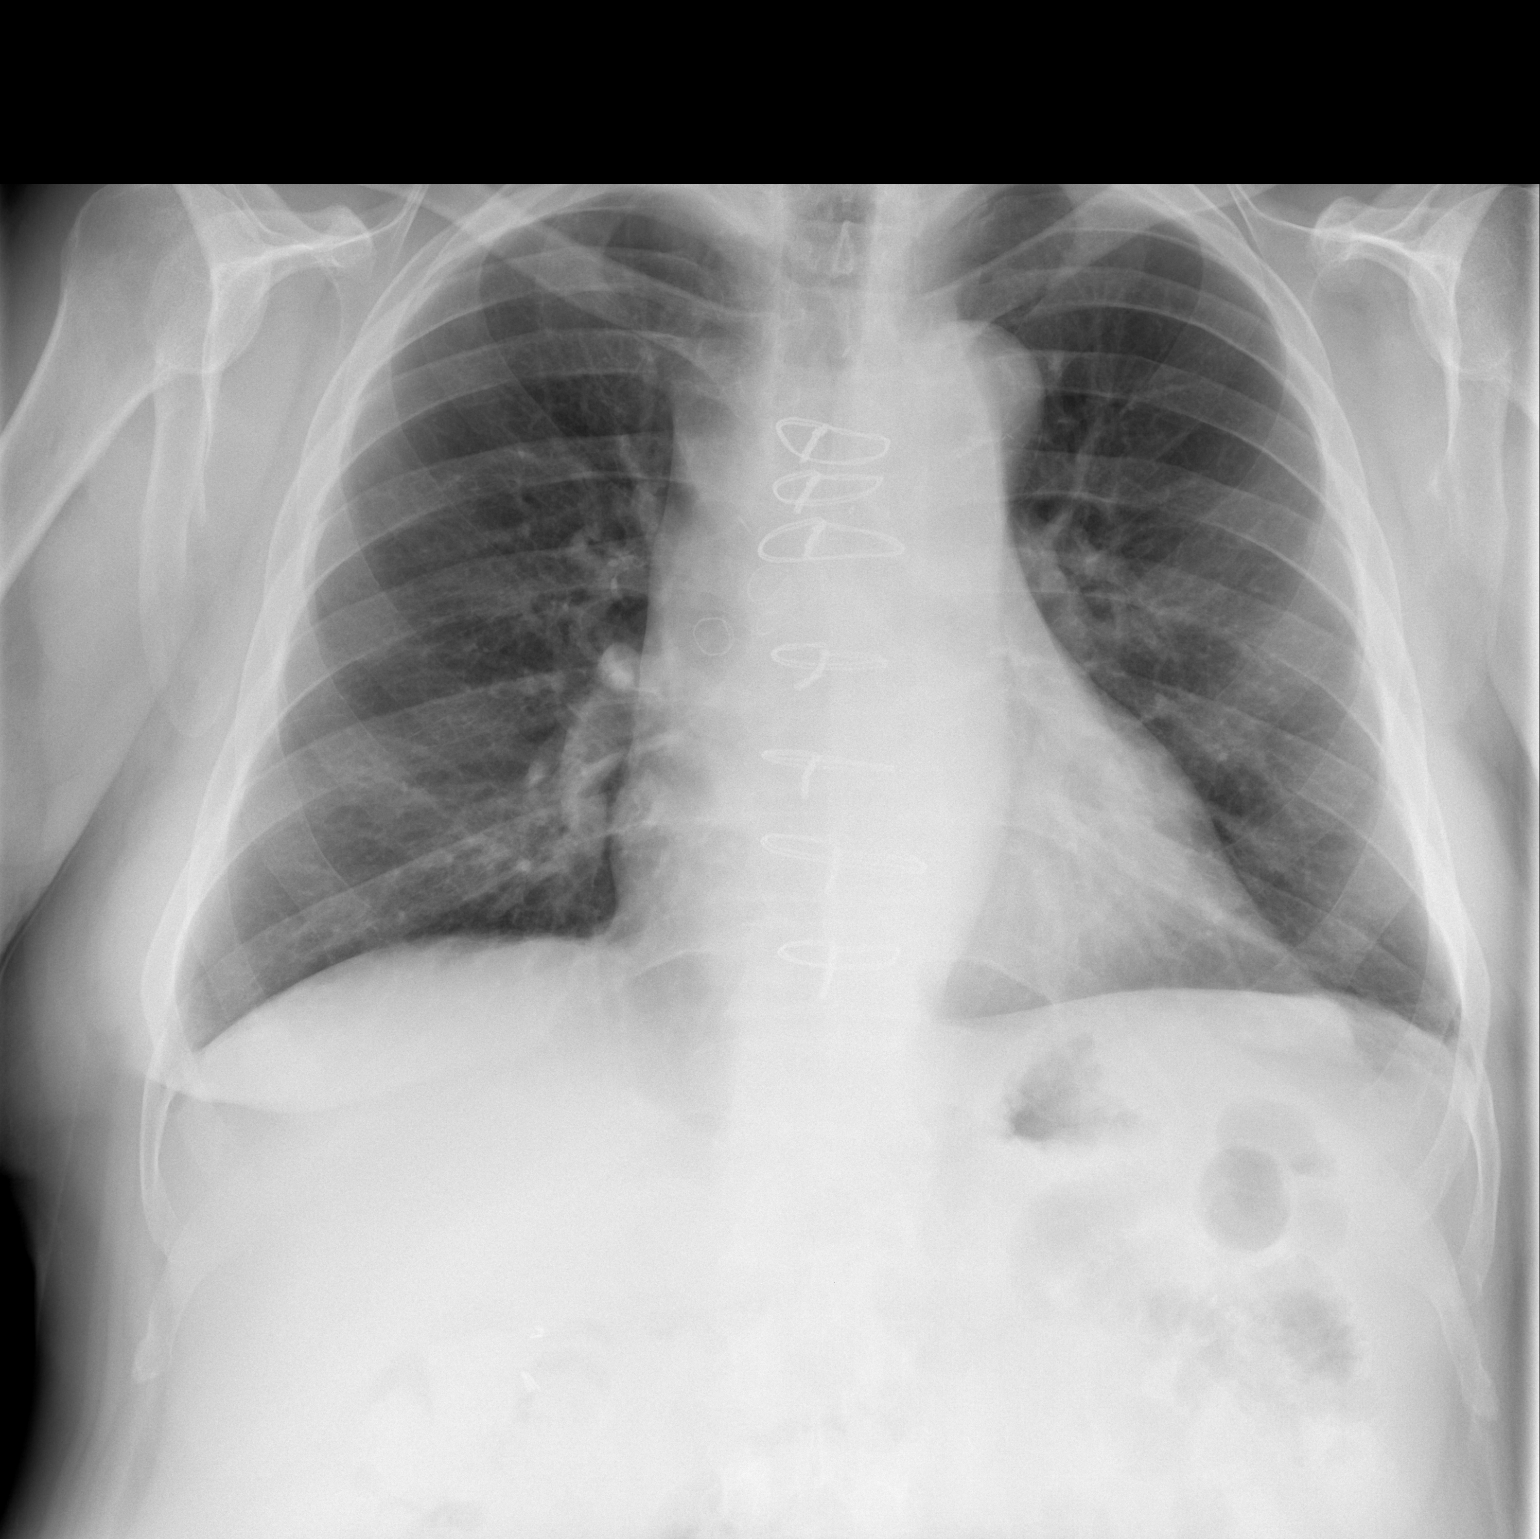

[w chest lat]
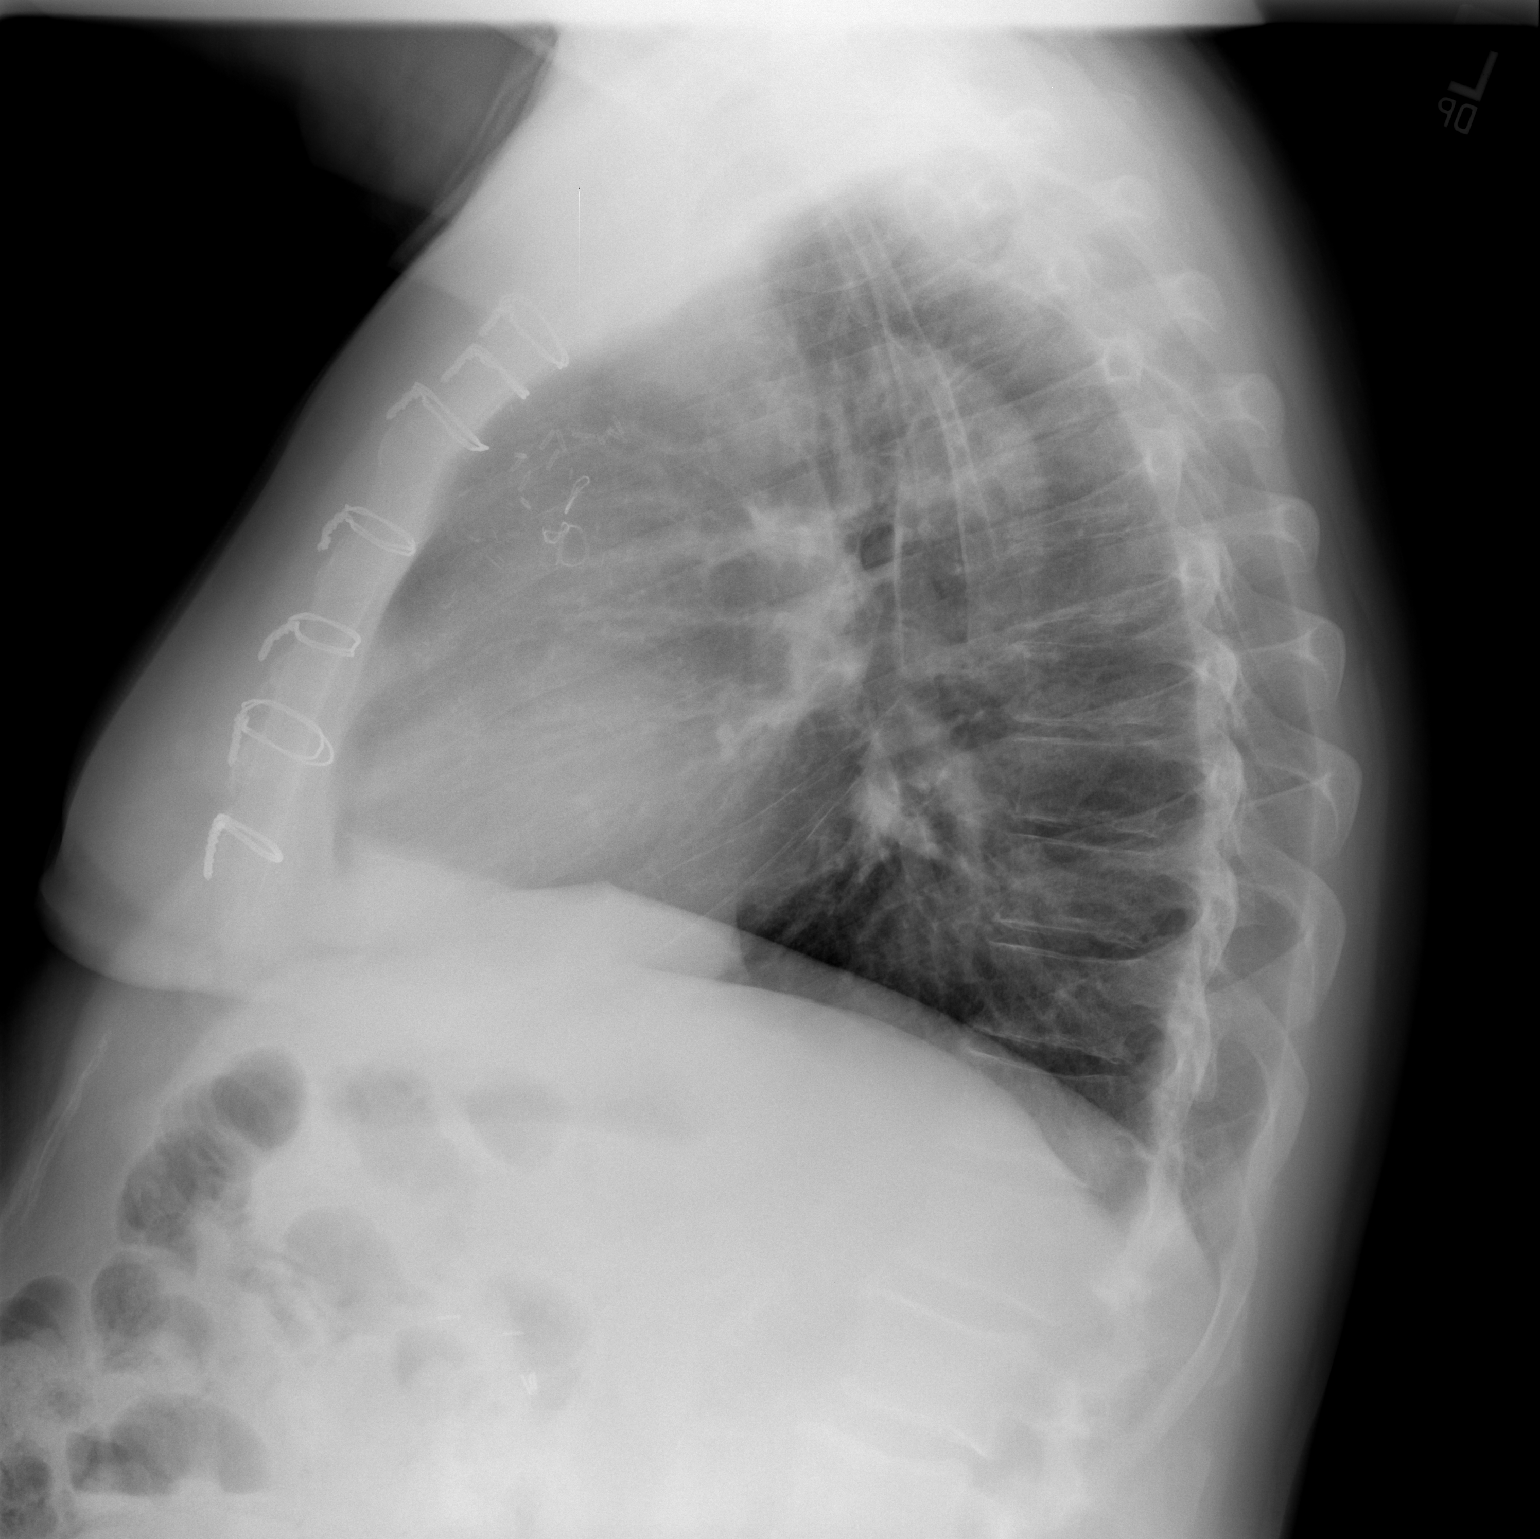

[2 of 2 positions shown; findings below may reference images not displayed]

FINDINGS: Postop CABG. Heart size is normal. Vascularity is normal. Negative
for infiltrate effusion or mass.
IMPRESSION: No active cardiopulmonary disease.
